# Patient Record
Sex: Male | Born: 1950 | Race: Black or African American | Hispanic: No | Marital: Married | Smoking: Never smoker
Health system: Southern US, Community
[De-identification: ages and names within clinical notes are randomized; demographics above are authoritative.]

## PROBLEM LIST (undated history)

## (undated) DIAGNOSIS — M1990 Unspecified osteoarthritis, unspecified site: Secondary | ICD-10-CM

## (undated) DIAGNOSIS — G4733 Obstructive sleep apnea (adult) (pediatric): Secondary | ICD-10-CM

## (undated) HISTORY — DX: Obstructive sleep apnea (adult) (pediatric): G47.33

## (undated) HISTORY — DX: Unspecified osteoarthritis, unspecified site: M19.90

## (undated) HISTORY — PX: WRIST SURGERY: SHX841

---

## 1998-12-05 ENCOUNTER — Other Ambulatory Visit: Admission: RE | Admit: 1998-12-05 | Discharge: 1998-12-05 | Payer: Self-pay | Admitting: Orthopedic Surgery

## 2001-11-11 ENCOUNTER — Ambulatory Visit (HOSPITAL_BASED_OUTPATIENT_CLINIC_OR_DEPARTMENT_OTHER): Admission: RE | Admit: 2001-11-11 | Discharge: 2001-11-11 | Payer: Self-pay | Admitting: *Deleted

## 2011-09-21 ENCOUNTER — Ambulatory Visit (INDEPENDENT_AMBULATORY_CARE_PROVIDER_SITE_OTHER): Payer: BC Managed Care – PPO | Admitting: Pulmonary Disease

## 2011-09-21 ENCOUNTER — Encounter: Payer: Self-pay | Admitting: Pulmonary Disease

## 2011-09-21 VITALS — BP 142/82 | HR 83 | Temp 98.0°F | Ht 71.0 in | Wt 223.2 lb

## 2011-09-21 DIAGNOSIS — G4733 Obstructive sleep apnea (adult) (pediatric): Secondary | ICD-10-CM | POA: Insufficient documentation

## 2011-09-21 NOTE — Assessment & Plan Note (Signed)
The patient has a history of severe obstructive sleep apnea, and has been compliant with CPAP since his last visit in 2003.  He feels that he is doing well, and has kept up with mask and filters changes.  However, he is overdue for a new CPAP machine, and will set this up for him.  I have encouraged him to work on weight reduction, and to followup with me in one year.

## 2011-09-21 NOTE — Patient Instructions (Signed)
Will arrange for you to get a new cpap machine at the same pressure. Work on [employer] loss Will see you back in one year.

## 2011-09-21 NOTE — Progress Notes (Signed)
  Subjective:    Patient ID: Kurt Underwood, male    DOB: Mar 06, 1951, 60 y.o.   MRN: [id number]  HPI The patient is a 61 year old male who comes in today for management of obstructive sleep apnea.  He was diagnosed with severe OSA 2003, with an AHI of 66 events per hour.  He was seen in the office, and started on CPAP therapy.  I have not seen him back since 2003, but the patient states that he has been very compliant with his device.  He uses a full face mask, and has kept up with mask changes and filters.  He feels that he is sleeping very well, and is rested in the mornings upon arising.  He is satisfied with his alertness during the day, and denies any sleepiness with driving.  His weight is neutral since the last visit.  His Epworth score today is 6.  Sleep Questionnaire: What time do you typically go to bed?( Between what hours) 9:30- 10:00 pm How long does it take you to fall asleep? 30 mins How many times during the night do you wake up? 0 What time do you get out of bed to start your day? 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 (In pounds) 0 oz (0 kg) Have you ever had a sleep study before? Yes If yes, location of study? [facility] If yes, date of study? 2003 Do you currently use CPAP? Yes If so, what pressure? Do you wear oxygen at any time? No    Review of Systems  Constitutional: Negative for fever and unexpected weight change.  HENT: Negative for ear pain, nosebleeds, congestion, sore throat, rhinorrhea, sneezing, trouble swallowing, dental problem, postnasal drip and sinus pressure.   Eyes: Negative for redness and itching.  Respiratory: Negative for cough, chest tightness, shortness of breath and wheezing.   Cardiovascular: Negative for palpitations and leg swelling.  Gastrointestinal: Negative for nausea and vomiting.  Genitourinary: Negative for dysuria.  Musculoskeletal: Negative for joint  swelling.  Skin: Negative for rash.  Neurological: Negative for headaches.  Hematological: Does not bruise/bleed easily.  Psychiatric/Behavioral: Negative for dysphoric mood. The patient is not nervous/anxious.        Objective:   Physical Exam Constitutional:  Well developed, no acute distress  HENT:  Nares patent without discharge  Oropharynx without exudate, palate and uvula are elongated.  Eyes:  Perrla, eomi, no scleral icterus  Neck:  No JVD, no TMG  Cardiovascular:  Normal rate, regular rhythm, no rubs or gallops.  No murmurs        Intact distal pulses  Pulmonary :  Normal breath sounds, no stridor or respiratory distress   No rales, rhonchi, or wheezing  Abdominal:  Soft, nondistended, bowel sounds present.  No tenderness noted.   Musculoskeletal:  No lower extremity edema noted.  Lymph Nodes:  No cervical lymphadenopathy noted  Skin:  No cyanosis noted  Neurologic:  Alert, appropriate, moves all 4 extremities without obvious deficit.         Assessment & Plan:

## 2012-01-10 ENCOUNTER — Telehealth: Payer: Self-pay | Admitting: Pulmonary Disease

## 2012-01-10 NOTE — Telephone Encounter (Signed)
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 Pt called stating that he was seen by [facility] in Jan 2013 for CPAP f/u and was needing notes or a letter faxed to his Dr at [facility] stating that he has been compliant with his CPAP machine. Per [facility] no download was done at office visit 09-21-11. 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 EHR 09/21/2011 OFFICE VISIT NOTE with [facility] faxed to [phone]. Angelica Chessman will contact our office if further information is needed once the Dr reviews the notes faxed.  Seward Meth, [facility]ena Saner Medical Assistant 01/10/12

## 2012-07-07 ENCOUNTER — Other Ambulatory Visit (INDEPENDENT_AMBULATORY_CARE_PROVIDER_SITE_OTHER): Payer: BC Managed Care – PPO

## 2012-07-07 ENCOUNTER — Ambulatory Visit (INDEPENDENT_AMBULATORY_CARE_PROVIDER_SITE_OTHER): Payer: BC Managed Care – PPO | Admitting: Internal Medicine

## 2012-07-07 ENCOUNTER — Ambulatory Visit (INDEPENDENT_AMBULATORY_CARE_PROVIDER_SITE_OTHER)
Admission: RE | Admit: 2012-07-07 | Discharge: 2012-07-07 | Disposition: A | Discharge status: Home / Self Care | Payer: BC Managed Care – PPO | Source: Ambulatory Visit | Attending: Internal Medicine | Admitting: Internal Medicine

## 2012-07-07 ENCOUNTER — Encounter: Payer: Self-pay | Admitting: Internal Medicine

## 2012-07-07 VITALS — BP 130/80 | HR 65 | Temp 98.2°F | Resp 16 | Ht 71.0 in | Wt 220.1 lb

## 2012-07-07 DIAGNOSIS — R9431 Abnormal electrocardiogram [ECG] [EKG]: Secondary | ICD-10-CM | POA: Insufficient documentation

## 2012-07-07 DIAGNOSIS — M545 Low back pain: Secondary | ICD-10-CM

## 2012-07-07 DIAGNOSIS — Z136 Encounter for screening for cardiovascular disorders: Secondary | ICD-10-CM | POA: Insufficient documentation

## 2012-07-07 DIAGNOSIS — Z0000 Encounter for general adult medical examination without abnormal findings: Secondary | ICD-10-CM

## 2012-07-07 LAB — COMPREHENSIVE METABOLIC PANEL
ALT: 26 U/L (ref 0–53)
AST: 26 U/L (ref 0–37)
Albumin: 4.2 g/dL (ref 3.5–5.2)
Alkaline Phosphatase: 56 U/L (ref 39–117)
Calcium: 9.2 mg/dL (ref 8.4–10.5)
Chloride: 104 mEq/L (ref 96–112)
Potassium: 4.2 mEq/L (ref 3.5–5.1)
Sodium: 140 mEq/L (ref 135–145)
Total Protein: 7.3 g/dL (ref 6.0–8.3)

## 2012-07-07 LAB — URINALYSIS, ROUTINE W REFLEX MICROSCOPIC
Leukocytes, UA: NEGATIVE
Nitrite: NEGATIVE
Specific Gravity, Urine: 1.01 (ref 1.000–1.030)
Urine Glucose: NEGATIVE
Urobilinogen, UA: 0.2 (ref 0.0–1.0)

## 2012-07-07 LAB — CBC WITH DIFFERENTIAL/PLATELET
Basophils Absolute: 0 10*3/uL (ref 0.0–0.1)
Basophils Relative: 0.4 % (ref 0.0–3.0)
Eosinophils Absolute: 0.7 10*3/uL (ref 0.0–0.7)
Lymphocytes Relative: 34 % (ref 12.0–46.0)
MCHC: 33.2 g/dL (ref 30.0–36.0)
MCV: 88.5 fl (ref 78.0–100.0)
Monocytes Absolute: 0.3 10*3/uL (ref 0.1–1.0)
Neutrophils Relative %: 48.3 % (ref 43.0–77.0)
RDW: 13.7 % (ref 11.5–14.6)

## 2012-07-07 LAB — LIPID PANEL
Cholesterol: 181 mg/dL (ref 0–200)
LDL Cholesterol: 123 mg/dL — ABNORMAL HIGH (ref 0–99)
Total CHOL/HDL Ratio: 4
VLDL: 9.4 mg/dL (ref 0.0–40.0)

## 2012-07-07 NOTE — Assessment & Plan Note (Signed)
He has no s/s though his EKG shows a dramatic loss of voltage in V1 and V2, I have asked him to see cardiology to consider further testing

## 2012-07-07 NOTE — Patient Instructions (Signed)
Health Maintenance, Males A healthy lifestyle and preventative care can promote health and wellness.  Maintain regular health, dental, and eye exams.  Eat a healthy diet. Foods like vegetables, fruits, whole grains, low-fat dairy products, and lean protein foods contain the nutrients you need without too many calories. Decrease your intake of foods high in solid fats, added sugars, and salt. Get information about a proper diet from your caregiver, if necessary.  Regular physical exercise is one of the most important things you can do for your health. Most adults should get at least 150 minutes of moderate-intensity exercise (any activity that increases your heart rate and causes you to sweat) each week. In addition, most adults need muscle-strengthening exercises on 2 or more days a week.   Maintain a healthy weight. The body mass index (BMI) is a screening tool to identify possible weight problems. It provides an estimate of body fat based on height and weight. Your caregiver can help determine your BMI, and can help you achieve or maintain a healthy weight. For adults 20 years and older:  A BMI below 18.5 is considered underweight.  A BMI of 18.5 to 24.9 is normal.  A BMI of 25 to 29.9 is considered overweight.  A BMI of 30 and above is considered obese.  Maintain normal blood lipids and cholesterol by exercising and minimizing your intake of saturated fat. Eat a balanced diet with plenty of fruits and vegetables. Blood tests for lipids and cholesterol should begin at age 20 and be repeated every 5 years. If your lipid or cholesterol levels are high, you are over 50, or you are a high risk for heart disease, you may need your cholesterol levels checked more frequently.Ongoing high lipid and cholesterol levels should be treated with medicines, if diet and exercise are not effective.  If you smoke, find out from your caregiver how to quit. If you do not use tobacco, do not start.  If you  choose to drink alcohol, do not exceed 2 drinks per day. One drink is considered to be 12 ounces (355 mL) of beer, 5 ounces (148 mL) of wine, or 1.5 ounces (44 mL) of liquor.  Avoid use of street drugs. Do not share needles with anyone. Ask for help if you need support or instructions about stopping the use of drugs.  High blood pressure causes heart disease and increases the risk of stroke. Blood pressure should be checked at least every 1 to 2 years. Ongoing high blood pressure should be treated with medicines if weight loss and exercise are not effective.  If you are 45 to 61 years old, ask your caregiver if you should take aspirin to prevent heart disease.  Diabetes screening involves taking a blood sample to check your fasting blood sugar level. This should be done once every 3 years, after age 45, if you are within normal weight and without risk factors for diabetes. Testing should be considered at a younger age or be carried out more frequently if you are overweight and have at least 1 risk factor for diabetes.  Colorectal cancer can be detected and often prevented. Most routine colorectal cancer screening begins at the age of 50 and continues through age 75. However, your caregiver may recommend screening at an earlier age if you have risk factors for colon cancer. On a yearly basis, your caregiver may provide home test kits to check for hidden blood in the stool. Use of a small camera at the end of a tube,   to directly examine the colon (sigmoidoscopy or colonoscopy), can detect the earliest forms of colorectal cancer. Talk to your caregiver about this at age 50, when routine screening begins. Direct examination of the colon should be repeated every 5 to 10 years through age 75, unless early forms of pre-cancerous polyps or small growths are found.  Hepatitis C blood testing is recommended for all people born from 1945 through 1965 and any individual with known risks for hepatitis C.  Healthy  men should no longer receive prostate-specific antigen (PSA) blood tests as part of routine cancer screening. Consult with your caregiver about prostate cancer screening.  Testicular cancer screening is not recommended for adolescents or adult males who have no symptoms. Screening includes self-exam, caregiver exam, and other screening tests. Consult with your caregiver about any symptoms you have or any concerns you have about testicular cancer.  Practice safe sex. Use condoms and avoid high-risk sexual practices to reduce the spread of sexually transmitted infections (STIs).  Use sunscreen with a sun protection factor (SPF) of 30 or greater. Apply sunscreen liberally and repeatedly throughout the day. You should seek shade when your shadow is shorter than you. Protect yourself by wearing long sleeves, pants, a wide-brimmed hat, and sunglasses year round, whenever you are outdoors.  Notify your caregiver of new moles or changes in moles, especially if there is a change in shape or color. Also notify your caregiver if a mole is larger than the size of a pencil eraser.  A one-time screening for abdominal aortic aneurysm (AAA) and surgical repair of large AAAs by sound wave imaging (ultrasonography) is recommended for ages 65 to 75 years who are current or former smokers.  Stay current with your immunizations. Document Released: 02/16/2008 Document Revised: 11/12/2011 Document Reviewed: 01/15/2011 ExitCare Patient Information 2013 [employer]. Back Pain, Adult Low back pain is very common. About 1 in 5 people have back pain.The cause of low back pain is rarely dangerous. The pain often gets better over time.About half of people with a sudden onset of back pain feel better in just 2 weeks. About 8 in 10 people feel better by 6 weeks.  CAUSES Some common causes of back pain include:  Strain of the muscles or ligaments supporting the spine.  Wear and tear (degeneration) of the spinal  discs.  Arthritis.  Direct injury to the back. DIAGNOSIS Most of the time, the direct cause of low back pain is not known.However, back pain can be treated effectively even when the exact cause of the pain is unknown.Answering your caregiver's questions about your overall health and symptoms is one of the most accurate ways to make sure the cause of your pain is not dangerous. If your caregiver needs more information, he or she may order lab work or imaging tests (X-rays or MRIs).However, even if imaging tests show changes in your back, this usually does not require surgery. HOME CARE INSTRUCTIONS For many people, back pain returns.Since low back pain is rarely dangerous, it is often a condition that people can learn to manageon their own.   Remain active. It is stressful on the back to sit or stand in one place. Do not sit, drive, or stand in one place for more than 30 minutes at a time. Take short walks on level surfaces as soon as pain allows.Try to increase the length of time you walk each day.  Do not stay in bed.Resting more than 1 or 2 days can delay your recovery.  Do not   avoid exercise or work.Your body is made to move.It is not dangerous to be active, even though your back may hurt.Your back will likely heal faster if you return to being active before your pain is gone.  Pay attention to your body when you bend and lift. Many people have less discomfortwhen lifting if they bend their knees, keep the load close to their bodies,and avoid twisting. Often, the most comfortable positions are those that put less stress on your recovering back.  Find a comfortable position to sleep. Use a firm mattress and lie on your side with your knees slightly bent. If you lie on your back, put a pillow under your knees.  Only take over-the-counter or prescription medicines as directed by your caregiver. Over-the-counter medicines to reduce pain and inflammation are often the most  helpful.Your caregiver may prescribe muscle relaxant drugs.These medicines help dull your pain so you can more quickly return to your normal activities and healthy exercise.  Put ice on the injured area.  Put ice in a plastic bag.  Place a towel between your skin and the bag.  Leave the ice on for 15 to 20 minutes, 3 to 4 times a day for the first 2 to 3 days. After that, ice and heat may be alternated to reduce pain and spasms.  Ask your caregiver about trying back exercises and gentle massage. This may be of some benefit.  Avoid feeling anxious or stressed.Stress increases muscle tension and can worsen back pain.It is important to recognize when you are anxious or stressed and learn ways to manage it.Exercise is a great option. SEEK MEDICAL CARE IF:  You have pain that is not relieved with rest or medicine.  You have pain that does not improve in 1 week.  You have new symptoms.  You are generally not feeling well. SEEK IMMEDIATE MEDICAL CARE IF:   You have pain that radiates from your back into your legs.  You develop new bowel or bladder control problems.  You have unusual weakness or numbness in your arms or legs.  You develop nausea or vomiting.  You develop abdominal pain.  You feel faint. Document Released: 08/20/2005 Document Revised: 02/19/2012 Document Reviewed: 01/08/2011 ExitCare Patient Information 2013 [employer].  

## 2012-07-07 NOTE — Assessment & Plan Note (Signed)
I will check plain films on him today, he will continue nsaids as needed

## 2012-07-07 NOTE — Assessment & Plan Note (Signed)
Exam done, he refused all vaccines today, labs ordered, pt ed material was given

## 2012-07-07 NOTE — Progress Notes (Signed)
Subjective:    Patient ID: Kurt Underwood, male    DOB: Sep 04, 1950, 61 y.o.   MRN: [id number]  Back Pain This is a chronic problem. The current episode started more than 1 year ago. The problem occurs intermittently. The problem has been gradually worsening since onset. The pain is present in the lumbar spine. The quality of the pain is described as aching. The pain radiates to the left thigh and right thigh. The pain is at a severity of 2/10. The pain is mild. The pain is worse during the day. The symptoms are aggravated by position and bending. Stiffness is present all day. Pertinent negatives include no abdominal pain, bladder incontinence, bowel incontinence, chest pain, dysuria, fever, headaches, leg pain, numbness, paresis, paresthesias, pelvic pain, perianal numbness, tingling, weakness or weight loss. He has tried NSAIDs for the symptoms. The treatment provided moderate relief.      Review of Systems  Constitutional: Negative for fever, chills, weight loss, diaphoresis, activity change, appetite change, fatigue and unexpected weight change.  HENT: Negative.   Eyes: Negative.   Respiratory: Negative for cough, choking, chest tightness, shortness of breath, wheezing and stridor.   Cardiovascular: Negative for chest pain, palpitations and leg swelling.  Gastrointestinal: Negative for nausea, vomiting, abdominal pain, diarrhea, constipation and bowel incontinence.  Genitourinary: Negative.  Negative for bladder incontinence, dysuria and pelvic pain.  Musculoskeletal: Positive for back pain. Negative for myalgias, joint swelling, arthralgias and gait problem.  Skin: Negative for color change, pallor, rash and wound.  Neurological: Negative for dizziness, tingling, syncope, weakness, light-headedness, numbness, headaches and paresthesias.  Hematological: Negative for adenopathy. Does not bruise/bleed easily.  Psychiatric/Behavioral: Negative.        Objective:   Physical Exam  Vitals  reviewed. Constitutional: He is oriented to person, place, and time. He appears well-developed and well-nourished. No distress.  HENT:  Head: Normocephalic and atraumatic.  Mouth/Throat: Oropharynx is clear and moist. No oropharyngeal exudate.  Eyes: Conjunctivae normal are normal. Right eye exhibits no discharge. Left eye exhibits no discharge. No scleral icterus.  Neck: Normal range of motion. Neck supple. No JVD present. No tracheal deviation present. No thyromegaly present.  Cardiovascular: Normal rate, regular rhythm, normal heart sounds and intact distal pulses.  Exam reveals no gallop and no friction rub.   No murmur heard. Pulmonary/Chest: Effort normal and breath sounds normal. No stridor. No respiratory distress. He has no wheezes. He has no rales. He exhibits no tenderness.  Abdominal: Soft. Bowel sounds are normal. He exhibits no distension and no mass. There is no tenderness. There is no rebound and no guarding. Hernia confirmed negative in the right inguinal area and confirmed negative in the left inguinal area.  Genitourinary: Rectum normal, prostate normal, testes normal and penis normal. Rectal exam shows no external hemorrhoid, no internal hemorrhoid, no fissure, no mass, no tenderness and anal tone normal. Guaiac negative stool. Prostate is not enlarged and not tender. Right testis shows no mass, no swelling and no tenderness. Right testis is descended. Left testis shows no mass, no swelling and no tenderness. Left testis is descended. Uncircumcised. No phimosis, paraphimosis, hypospadias, penile erythema or penile tenderness. No discharge found.  Musculoskeletal: Normal range of motion. He exhibits no edema and no tenderness.       Lumbar back: Normal. He exhibits normal range of motion, no tenderness, no bony tenderness, no swelling, no edema, no deformity, no laceration, no pain, no spasm and normal pulse.  Lymphadenopathy:    He has no cervical adenopathy.  Right: No  inguinal adenopathy present.       Left: No inguinal adenopathy present.  Neurological: He is alert and oriented to person, place, and time. He has normal strength. He displays no atrophy, no tremor and normal reflexes. No cranial nerve deficit or sensory deficit. He exhibits normal muscle tone. He displays a negative Romberg sign. He displays no seizure activity. Coordination and gait normal.  Reflex Scores:      Tricep reflexes are 1+ on the right side and 1+ on the left side.      Bicep reflexes are 1+ on the right side and 1+ on the left side.      Brachioradialis reflexes are 1+ on the right side and 1+ on the left side.      Patellar reflexes are 1+ on the right side and 1+ on the left side.      Achilles reflexes are 1+ on the right side and 1+ on the left side.      - SLR in BLE  Skin: Skin is warm and dry. No rash noted. He is not diaphoretic. No erythema. No pallor.  Psychiatric: He has a normal mood and affect. His behavior is normal. Judgment and thought content normal.          Assessment & Plan:

## 2012-07-10 ENCOUNTER — Institutional Professional Consult (permissible substitution): Payer: BC Managed Care – PPO | Admitting: Cardiovascular Disease

## 2012-07-30 ENCOUNTER — Institutional Professional Consult (permissible substitution): Payer: BC Managed Care – PPO | Admitting: Cardiovascular Disease

## 2012-09-22 ENCOUNTER — Ambulatory Visit (INDEPENDENT_AMBULATORY_CARE_PROVIDER_SITE_OTHER): Payer: BC Managed Care – PPO | Admitting: Pulmonary Disease

## 2012-09-22 ENCOUNTER — Encounter: Payer: Self-pay | Admitting: Pulmonary Disease

## 2012-09-22 VITALS — BP 132/86 | HR 70 | Temp 98.2°F | Ht 71.0 in | Wt 228.0 lb

## 2012-09-22 DIAGNOSIS — G4733 Obstructive sleep apnea (adult) (pediatric): Secondary | ICD-10-CM

## 2012-09-22 NOTE — Progress Notes (Signed)
  Subjective:    Patient ID: Kurt Underwood, male    DOB: 1951-07-05, 62 y.o.   MRN: [id number]  HPI The patient comes in today for followup of his obstructive sleep apnea.  He is wearing CPAP compliant way, and is having no issues with his pressure.  He thinks his mask needs to be sized a little larger, and also requires new headgear.  He feels that he is sleeping well, and denies any alertness issues during the day.  Of note, his weight is up 5 pounds since last visit.   Review of Systems  Constitutional: Negative for fever and unexpected weight change.  HENT: Negative for ear pain, nosebleeds, congestion, sore throat, rhinorrhea, sneezing, trouble swallowing, dental problem, postnasal drip and sinus pressure.   Eyes: Negative for redness and itching.  Respiratory: Negative for cough, chest tightness, shortness of breath and wheezing.   Cardiovascular: Negative for palpitations and leg swelling.  Gastrointestinal: Negative for nausea and vomiting.  Genitourinary: Negative for dysuria.  Musculoskeletal: Positive for back pain. Negative for joint swelling.  Skin: Negative for rash.  Neurological: Negative for headaches.  Hematological: Does not bruise/bleed easily.  Psychiatric/Behavioral: Negative for dysphoric mood. The patient is not nervous/anxious.        Objective:   Physical Exam Ow male in nad Nose without purulence or discharge noted no skin breakdown or pressure necrosis from the CPAP mask No lymphadenopathy or thyromegaly Lower extremities without edema, cyanosis Alert and oriented, moves all 4 extremities.  Does not appear to be sleepy.        Assessment & Plan:

## 2012-09-22 NOTE — Patient Instructions (Addendum)
Will send an order to your equipment company to work with you on mask fit and headgear. Work on weight loss followup with me in one year.

## 2012-09-22 NOTE — Assessment & Plan Note (Signed)
The patient is doing well overall with CPAP, and feels that it has helped his sleep and daytime alertness.  We will have his equipment company work with him a mask fit and head gear, and I encouraged him to work aggressively on weight loss.  He will followup again in one year.

## 2012-10-10 ENCOUNTER — Ambulatory Visit (INDEPENDENT_AMBULATORY_CARE_PROVIDER_SITE_OTHER): Payer: BC Managed Care – PPO | Admitting: Internal Medicine

## 2012-10-10 ENCOUNTER — Encounter: Payer: Self-pay | Admitting: Internal Medicine

## 2012-10-10 VITALS — BP 130/86 | HR 64 | Temp 98.7°F | Resp 16 | Wt 224.0 lb

## 2012-10-10 DIAGNOSIS — M545 Low back pain: Secondary | ICD-10-CM

## 2012-10-10 MED ORDER — TRAMADOL HCL 50 MG PO TABS: 50.0000 mg | ORAL_TABLET | Freq: Three times a day (TID) | ORAL | Status: DC | PRN

## 2012-10-10 NOTE — Progress Notes (Signed)
Subjective:    Patient ID: Kurt Underwood, male    DOB: [birth year], 62 y.o.   MRN: [id number]  Back Pain This is a recurrent problem. The current episode started more than 1 month ago. The problem occurs intermittently. The problem is unchanged. The pain is present in the lumbar spine. The quality of the pain is described as aching. The pain radiates to the left thigh and right thigh. The pain is at a severity of 4/10. The pain is moderate. The pain is the same all the time. The symptoms are aggravated by position, bending and standing. Associated symptoms include leg pain (left) and numbness (left leg). Pertinent negatives include no abdominal pain, bladder incontinence, bowel incontinence, chest pain, dysuria, headaches, paresis, paresthesias, pelvic pain, perianal numbness, tingling, weakness or weight loss. He has tried NSAIDs for the symptoms. The treatment provided mild relief.      Review of Systems  Constitutional: Negative.  Negative for weight loss.  HENT: Negative.   Eyes: Negative.   Respiratory: Negative.   Cardiovascular: Negative.  Negative for chest pain.  Gastrointestinal: Negative.  Negative for nausea, vomiting, abdominal pain, diarrhea, constipation and bowel incontinence.  Genitourinary: Negative.  Negative for bladder incontinence, dysuria and pelvic pain.  Musculoskeletal: Positive for back pain. Negative for myalgias, joint swelling, arthralgias and gait problem.  Skin: Negative.   Neurological: Positive for numbness (left leg). Negative for tingling, weakness, light-headedness, headaches and paresthesias.  Hematological: Negative.  Negative for adenopathy. Does not bruise/bleed easily.  Psychiatric/Behavioral: Negative.        Objective:   Physical Exam  Vitals reviewed. Constitutional: He is oriented to person, place, and time. He appears well-developed and well-nourished. No distress.  HENT:  Head: Normocephalic and atraumatic.  Mouth/Throat: Oropharynx is  clear and moist. No oropharyngeal exudate.  Eyes: Conjunctivae normal are normal. Right eye exhibits no discharge. Left eye exhibits no discharge. No scleral icterus.  Neck: Normal range of motion. Neck supple. No JVD present. No tracheal deviation present. No thyromegaly present.  Cardiovascular: Normal rate, regular rhythm, normal heart sounds and intact distal pulses.  Exam reveals no gallop and no friction rub.   No murmur heard. Pulmonary/Chest: Effort normal and breath sounds normal. No stridor. No respiratory distress. He has no wheezes. He has no rales. He exhibits no tenderness.  Abdominal: Soft. Bowel sounds are normal. He exhibits no distension and no mass. There is no tenderness. There is no rebound and no guarding.  Musculoskeletal: Normal range of motion. He exhibits no edema and no tenderness.       Lumbar back: Normal. He exhibits normal range of motion, no tenderness, no bony tenderness, no swelling, no edema, no deformity, no laceration, no pain and no spasm.  Lymphadenopathy:    He has no cervical adenopathy.  Neurological: He is alert and oriented to person, place, and time. He displays no atrophy, no tremor and normal reflexes. No cranial nerve deficit or sensory deficit. He exhibits normal muscle tone. He displays a negative Romberg sign. He displays no seizure activity. Coordination and gait normal.  Reflex Scores:      Brachioradialis reflexes are 0 on the right side and 0 on the left side.      Patellar reflexes are 0 on the right side and 0 on the left side.      Achilles reflexes are 0 on the right side and 0 on the left side.      - SLR in BLE  Skin: Skin is warm and  dry. No rash noted. He is not diaphoretic. No erythema. No pallor.  Psychiatric: He has a normal mood and affect. His behavior is normal. Judgment and thought content normal.      Lab Results  Component Value Date   WBC 6.3 07/07/2012   HGB 14.2 07/07/2012   HCT 42.6 07/07/2012   PLT 304.0 07/07/2012    GLUCOSE 98 07/07/2012   CHOL 181 07/07/2012   TRIG 47.0 07/07/2012   HDL 48.40 07/07/2012   LDLCALC 123* 07/07/2012   ALT 26 07/07/2012   AST 26 07/07/2012   NA 140 07/07/2012   K 4.2 07/07/2012   CL 104 07/07/2012   CREATININE 0.7 07/07/2012   BUN 12 07/07/2012   CO2 29 07/07/2012   TSH 1.66 07/07/2012   PSA 0.80 07/07/2012     LUMBAR SPINE - COMPLETE 4+ VIEW  Comparison: None.  Findings: Five views of the lumbar spine submitted. There is mild  compression deformity superior endplate of the L1 vertebral body of  indeterminate age. Clinical correlation is necessary. Mild disc  space flattening with mild anterior spurring at L1 and L2 level.  Mild anterior spurring upper endplate of the L3-L4 and L5 vertebral  body.  IMPRESSION:  Mild compression deformity superior endplate of the L1 vertebral  body of indeterminate age. Clinical correlation is necessary.  Mild disc space flattening with mild anterior spurring at L1 and L2  level. Mild anterior spurring upper endplate of the L3, L4 and L5  vertebral body.  Assessment & Plan:

## 2012-10-10 NOTE — Assessment & Plan Note (Signed)
Will continue nsaids and add tramadol for pain relief He wants to see pain management to consider ESI

## 2012-10-10 NOTE — Patient Instructions (Signed)
Back Pain, Adult Low back pain is very common. About 1 in 5 people have back pain.The cause of low back pain is rarely dangerous. The pain often gets better over time.About half of people with a sudden onset of back pain feel better in just 2 weeks. About 8 in 10 people feel better by 6 weeks.  CAUSES Some common causes of back pain include:  Strain of the muscles or ligaments supporting the spine.  Wear and tear (degeneration) of the spinal discs.  Arthritis.  Direct injury to the back. DIAGNOSIS Most of the time, the direct cause of low back pain is not known.However, back pain can be treated effectively even when the exact cause of the pain is unknown.Answering your caregiver's questions about your overall health and symptoms is one of the most accurate ways to make sure the cause of your pain is not dangerous. If your caregiver needs more information, he or she may order lab work or imaging tests (X-rays or MRIs).However, even if imaging tests show changes in your back, this usually does not require surgery. HOME CARE INSTRUCTIONS For many people, back pain returns.Since low back pain is rarely dangerous, it is often a condition that people can learn to manageon their own.   Remain active. It is stressful on the back to sit or stand in one place. Do not sit, drive, or stand in one place for more than 30 minutes at a time. Take short walks on level surfaces as soon as pain allows.Try to increase the length of time you walk each day.  Do not stay in bed.Resting more than 1 or 2 days can delay your recovery.  Do not avoid exercise or work.Your body is made to move.It is not dangerous to be active, even though your back may hurt.Your back will likely heal faster if you return to being active before your pain is gone.  Pay attention to your body when you bend and lift. Many people have less discomfortwhen lifting if they bend their knees, keep the load close to their bodies,and  avoid twisting. Often, the most comfortable positions are those that put less stress on your recovering back.  Find a comfortable position to sleep. Use a firm mattress and lie on your side with your knees slightly bent. If you lie on your back, put a pillow under your knees.  Only take over-the-counter or prescription medicines as directed by your caregiver. Over-the-counter medicines to reduce pain and inflammation are often the most helpful.Your caregiver may prescribe muscle relaxant drugs.These medicines help dull your pain so you can more quickly return to your normal activities and healthy exercise.  Put ice on the injured area.  Put ice in a plastic bag.  Place a towel between your skin and the bag.  Leave the ice on for 15 to 20 minutes, 3 to 4 times a day for the first 2 to 3 days. After that, ice and heat may be alternated to reduce pain and spasms.  Ask your caregiver about trying back exercises and gentle massage. This may be of some benefit.  Avoid feeling anxious or stressed.Stress increases muscle tension and can worsen back pain.It is important to recognize when you are anxious or stressed and learn ways to manage it.Exercise is a great option. SEEK MEDICAL CARE IF:  You have pain that is not relieved with rest or medicine.  You have pain that does not improve in 1 week.  You have new symptoms.  You are generally   not feeling well. SEEK IMMEDIATE MEDICAL CARE IF:   You have pain that radiates from your back into your legs.  You develop new bowel or bladder control problems.  You have unusual weakness or numbness in your arms or legs.  You develop nausea or vomiting.  You develop abdominal pain.  You feel faint. Document Released: 08/20/2005 Document Revised: 02/19/2012 Document Reviewed: 01/08/2011 [employer] Patient Information 2013 [employer].  

## 2012-10-30 ENCOUNTER — Encounter: Payer: Self-pay | Admitting: Physical Medicine & Rehabilitation

## 2012-11-24 ENCOUNTER — Ambulatory Visit (HOSPITAL_BASED_OUTPATIENT_CLINIC_OR_DEPARTMENT_OTHER): Payer: BC Managed Care – PPO | Admitting: Physical Medicine & Rehabilitation

## 2012-11-24 ENCOUNTER — Encounter: Payer: Self-pay | Admitting: Physical Medicine & Rehabilitation

## 2012-11-24 ENCOUNTER — Encounter: Payer: BC Managed Care – PPO | Attending: Physical Medicine & Rehabilitation

## 2012-11-24 VITALS — BP 152/75 | HR 65 | Resp 14 | Ht 71.0 in | Wt 224.0 lb

## 2012-11-24 DIAGNOSIS — X58XXXA Exposure to other specified factors, initial encounter: Secondary | ICD-10-CM | POA: Insufficient documentation

## 2012-11-24 DIAGNOSIS — Z5181 Encounter for therapeutic drug level monitoring: Secondary | ICD-10-CM

## 2012-11-24 DIAGNOSIS — M533 Sacrococcygeal disorders, not elsewhere classified: Secondary | ICD-10-CM

## 2012-11-24 DIAGNOSIS — Z79899 Other long term (current) drug therapy: Secondary | ICD-10-CM

## 2012-11-24 DIAGNOSIS — M549 Dorsalgia, unspecified: Secondary | ICD-10-CM

## 2012-11-24 DIAGNOSIS — M545 Low back pain, unspecified: Secondary | ICD-10-CM | POA: Insufficient documentation

## 2012-11-24 DIAGNOSIS — M79609 Pain in unspecified limb: Secondary | ICD-10-CM | POA: Insufficient documentation

## 2012-11-24 DIAGNOSIS — S32009A Unspecified fracture of unspecified lumbar vertebra, initial encounter for closed fracture: Secondary | ICD-10-CM | POA: Insufficient documentation

## 2012-11-24 NOTE — Patient Instructions (Signed)
Sacroiliac Joint Dysfunction The sacroiliac joint connects the lower part of the spine (the sacrum) with the bones of the pelvis. CAUSES  Sometimes, there is no obvious reason for sacroiliac joint dysfunction. Other times, it may occur   During pregnancy.  After injury, such as:  Car accidents.  Sport-related injuries.  Work-related injuries.  Due to one leg being shorter than the other.  Due to other conditions that affect the joints, such as:  Rheumatoid arthritis.  Gout.  Psoriasis.  Joint infection (septic arthritis). SYMPTOMS  Symptoms may include:  Pain in the:  Lower back.  Buttocks.  Groin.  Thighs and legs.  Difficult sitting, standing, walking, lying, bending or lifting. DIAGNOSIS  A number of tests may be used to help diagnose the cause of sacroiliac joint dysfunction, including:  Imaging tests to look for other causes of pain, including:  MRI.  CT scan.  Bone scan.  Diagnostic injection: During a special x-ray (called fluoroscopy), a needle is put into the sacroiliac joint. A numbing medicine is injected into the joint. If the pain is improved or stopped, the diagnosis of sacroiliac joint dysfunction is more likely. TREATMENT  There are a number of types of treatment used for sacroiliac joint dysfunction, including:  Only take over-the-counter or prescription medicines for pain, discomfort, or fever as directed by your caregiver.  Medications to relax muscles.  Rest. Decreasing activity can help cut down on painful muscle spasms and allow the back to heal.  Application of heat or ice to the lower back may improve muscle spasms and soothe pain.  Brace. A special back brace, called a sacroiliac belt, can help support the joint while your back is healing.  Physical therapy can help teach comfortable positions and exercises to strengthen muscles that support the sacroiliac joint.  Cortisone injections. Injections of steroid medicine into the  joint can help decrease swelling and improve pain.  Hyaluronic acid injections. This chemical improves lubrication within the sacroiliac joint, thereby decreasing pain.  Radiofrequency ablation. A special needle is placed into the joint, where it burns away nerves that are carrying pain messages from the joint.  Surgery. Because pain occurs during movement of the joint, screws and plates may be installed in order to limit or prevent joint motion. HOME CARE INSTRUCTIONS   Take all medications exactly as directed.  Follow instructions regarding both rest and physical activity, to avoid worsening the pain.  Do physical therapy exercises exactly as prescribed. SEEK IMMEDIATE MEDICAL CARE IF:  You experience increasingly severe pain.  You develop new symptoms, such as numbness or tingling in your legs or feet.  You lose bladder or bowel control.  Please complete physical therapy

## 2012-11-24 NOTE — Progress Notes (Signed)
Subjective:    Patient ID: Kurt Underwood, male    DOB: 1951-08-17, 62 y.o.   MRN: [id number]  HPI Larey Seat off of ladder at age 62. X-rays show compression fracture L1 of undetermined age Pain is now lower in the back. No new injury. Pain has been worsening over the last 9 months and radiates into the left calf. No numbness or tingling in the left leg. Feels weak in the left leg. Pain is relieved by tramadol. He does not need to take this every day. Does leg and back stretching exercises but has not been through physical therapy yet.  Recently retired as a [employer] architect.  LUMBAR SPINE - COMPLETE 4+ VIEW  Comparison: None.  Findings: Five views of the lumbar spine submitted. There is mild  compression deformity superior endplate of the L1 vertebral body of  indeterminate age. Clinical correlation is necessary. Mild disc  space flattening with mild anterior spurring at L1 and L2 level.  Mild anterior spurring upper endplate of the L3-L4 and L5 vertebral  body.  IMPRESSION:  Mild compression deformity superior endplate of the L1 vertebral  body of indeterminate age. Clinical correlation is necessary.  Mild disc space flattening with mild anterior spurring at L1 and L2  level. Mild anterior spurring upper endplate of the L3, L4 and L5  vertebral body.   Pain Inventory Average Pain 7 Pain Right Now 4 My pain is other  In the last 24 hours, has pain interfered with the following? General activity 3 Relation with others 3 Enjoyment of life 4 What TIME of day is your pain at its worst? daytime Sleep (in general) Good  Pain is worse with: walking, bending and standing Pain improves with: rest, heat/ice and medication Relief from Meds: 7  Mobility ability to climb steps?  yes do you drive?  yes  Function not employed: date last employed 14 retired  Neuro/Psych No problems in this area  Prior Studies Any changes since last visit?  no  Physicians involved in your care Any  changes since last visit?  no   Family History  Problem Relation Age of Onset  . Arthritis Other   . Hypertension Other   . Cancer Neg Hx   . Alcohol abuse Neg Hx   . Early death Neg Hx   . Heart disease Neg Hx   . Hyperlipidemia Neg Hx   . Kidney disease Neg Hx   . Stroke Neg Hx   . Hypertension Mother   . Hypertension Father    History   Social History  . Marital Status: Married    Spouse Name: N/A    Number of Children: Y  . Years of Education: N/A   Occupational History  . truck driver    Social History Main Topics  . Smoking status: Never Smoker   . Smokeless tobacco: Never Used  . Alcohol Use: No  . Drug Use: No  . Sexually Active: Yes   Other Topics Concern  . None   Social History Narrative  . None   Past Surgical History  Procedure Laterality Date  . Wrist surgery      Right   Past Medical History  Diagnosis Date  . OSA (obstructive sleep apnea)   . Arthritis    BP 152/75  Pulse 65  Resp 14  Ht 5\' 11"  (1.803 m)  Wt 224 lb (101.606 kg)  BMI 31.26 kg/m2  SpO2 99%     Review of Systems  Musculoskeletal: Positive for back  pain.  All other systems reviewed and are negative.       Objective:   Physical Exam  Nursing note and vitals reviewed. Constitutional: He is oriented to person, place, and time. He appears well-developed and well-nourished. No distress.  HENT:  Head: Normocephalic and atraumatic.  Right Ear: External ear normal.  Left Ear: External ear normal.  Eyes: Conjunctivae and EOM are normal. Pupils are equal, round, and reactive to light.  Neck: Normal range of motion.  Musculoskeletal:       Lumbar back: He exhibits decreased range of motion and pain. He exhibits no tenderness.  Negative straight leg raising Pain with leaning towards the left side No spine deformity Lumbar range of motion 75% of normal for flexion, extension, lateral patient and bending  Neurological: He is alert and oriented to person, place, and  time. He has normal strength. He displays no atrophy. No sensory deficit. He exhibits normal muscle tone. Coordination and gait normal.  Reflex Scores:      Patellar reflexes are 2+ on the right side and 2+ on the left side.      Achilles reflexes are 0 on the right side and 0 on the left side. Skin: Skin is warm and dry.  Psychiatric: He has a normal mood and affect.   Pain in the left SI  area with Faber's maneuver       Assessment & Plan:  1. Left-sided axial back pain radiating into the left lower extremity to the calf. No focal neurologic deficits. 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 We'll send him through physical therapy and failing this would proceed with sacroiliac injection. His pain is well controlled with tramadol which he takes on a intermittent basis. He does not take this every day  Do not think Patient will need narcotic analgesics.

## 2012-11-27 ENCOUNTER — Ambulatory Visit: Payer: BC Managed Care – PPO | Attending: Physical Medicine & Rehabilitation | Admitting: Physical Therapy

## 2012-11-27 DIAGNOSIS — IMO0001 Reserved for inherently not codable concepts without codable children: Secondary | ICD-10-CM | POA: Insufficient documentation

## 2012-11-27 DIAGNOSIS — M545 Low back pain, unspecified: Secondary | ICD-10-CM | POA: Insufficient documentation

## 2012-12-04 ENCOUNTER — Ambulatory Visit: Payer: BC Managed Care – PPO | Attending: Physical Medicine & Rehabilitation | Admitting: Physical Therapy

## 2012-12-04 DIAGNOSIS — M545 Low back pain, unspecified: Secondary | ICD-10-CM | POA: Insufficient documentation

## 2012-12-04 DIAGNOSIS — IMO0001 Reserved for inherently not codable concepts without codable children: Secondary | ICD-10-CM | POA: Insufficient documentation

## 2012-12-11 ENCOUNTER — Ambulatory Visit: Payer: BC Managed Care – PPO | Admitting: Physical Therapy

## 2012-12-17 ENCOUNTER — Ambulatory Visit: Payer: BC Managed Care – PPO | Admitting: Physical Therapy

## 2012-12-18 ENCOUNTER — Ambulatory Visit: Payer: BC Managed Care – PPO | Admitting: Physical Medicine & Rehabilitation

## 2012-12-24 ENCOUNTER — Ambulatory Visit: Payer: BC Managed Care – PPO | Admitting: Physical Therapy

## 2013-09-22 ENCOUNTER — Encounter: Payer: Self-pay | Admitting: Pulmonary Disease

## 2013-09-22 ENCOUNTER — Ambulatory Visit (INDEPENDENT_AMBULATORY_CARE_PROVIDER_SITE_OTHER): Payer: BC Managed Care – PPO | Admitting: Pulmonary Disease

## 2013-09-22 VITALS — BP 130/82 | HR 63 | Temp 98.3°F | Ht 71.0 in | Wt 228.2 lb

## 2013-09-22 DIAGNOSIS — G4733 Obstructive sleep apnea (adult) (pediatric): Secondary | ICD-10-CM

## 2013-09-22 NOTE — Patient Instructions (Signed)
Continue with cpap, and keep up with mask changes and supplies. Work on weight loss followup with me in one year.  

## 2013-09-22 NOTE — Assessment & Plan Note (Signed)
The pt is compliant with cpap, and feels he is sleeping well with adequate daytime alertness.  I have asked him to work on weight loss, and to keep up with his supplies.  I will see him back in one year.

## 2013-09-22 NOTE — Progress Notes (Signed)
   Subjective:    Patient ID: Kurt Underwood, Kurt Underwood    DOB: 27-Dec-1950, 63 y.o.   MRN: [id number]  HPI The patient comes in today for followup of his obstructive sleep apnea. He is wearing CPAP compliantly, and is having no issues with his mask fit or pressure. He is sleeping well at night, and has adequate daytime alertness. His weight is stable since the last visit.   Review of Systems  Constitutional: Negative for fever and unexpected weight change.  HENT: Negative for congestion, dental problem, ear pain, nosebleeds, postnasal drip, rhinorrhea, sinus pressure, sneezing, sore throat and trouble swallowing.   Eyes: Negative for redness and itching.  Respiratory: Negative for cough, chest tightness, shortness of breath and wheezing.   Cardiovascular: Negative for palpitations and leg swelling.  Gastrointestinal: Negative for nausea and vomiting.  Genitourinary: Negative for dysuria.  Musculoskeletal: Negative for joint swelling.  Skin: Negative for rash.  Neurological: Negative for headaches.  Hematological: Does not bruise/bleed easily.  Psychiatric/Behavioral: Negative for dysphoric mood. The patient is not nervous/anxious.        Objective:   Physical Exam Well-developed Kurt Underwood in no acute Nose without purulence or discharge noted Neck without lymphadenopathy or thyromegaly No skin breakdown or pressure necrosis from the CPAP mask Lower extremities without edema, no cyanosis Alert and oriented, moves all 4 extremities.       Assessment & Plan:

## 2013-10-21 ENCOUNTER — Ambulatory Visit (INDEPENDENT_AMBULATORY_CARE_PROVIDER_SITE_OTHER): Payer: BC Managed Care – PPO | Admitting: Internal Medicine

## 2013-10-21 ENCOUNTER — Ambulatory Visit: Payer: BC Managed Care – PPO | Admitting: Internal Medicine

## 2013-10-21 ENCOUNTER — Other Ambulatory Visit (INDEPENDENT_AMBULATORY_CARE_PROVIDER_SITE_OTHER): Payer: BC Managed Care – PPO

## 2013-10-21 ENCOUNTER — Encounter: Payer: Self-pay | Admitting: Internal Medicine

## 2013-10-21 VITALS — BP 138/82 | HR 77 | Temp 97.5°F | Resp 16 | Ht 71.0 in | Wt 222.0 lb

## 2013-10-21 DIAGNOSIS — N529 Male erectile dysfunction, unspecified: Secondary | ICD-10-CM | POA: Insufficient documentation

## 2013-10-21 DIAGNOSIS — Z0000 Encounter for general adult medical examination without abnormal findings: Secondary | ICD-10-CM

## 2013-10-21 DIAGNOSIS — J019 Acute sinusitis, unspecified: Secondary | ICD-10-CM

## 2013-10-21 LAB — CBC WITH DIFFERENTIAL/PLATELET
BASOS ABS: 0 10*3/uL (ref 0.0–0.1)
Basophils Relative: 0.6 % (ref 0.0–3.0)
EOS ABS: 0.4 10*3/uL (ref 0.0–0.7)
Eosinophils Relative: 5.7 % — ABNORMAL HIGH (ref 0.0–5.0)
HCT: 41.1 % (ref 39.0–52.0)
Hemoglobin: 13.4 g/dL (ref 13.0–17.0)
Lymphocytes Relative: 40.7 % (ref 12.0–46.0)
Lymphs Abs: 2.7 10*3/uL (ref 0.7–4.0)
MCHC: 32.7 g/dL (ref 30.0–36.0)
MCV: 89.5 fl (ref 78.0–100.0)
Monocytes Absolute: 0.5 10*3/uL (ref 0.1–1.0)
Monocytes Relative: 7.4 % (ref 3.0–12.0)
NEUTROS PCT: 45.6 % (ref 43.0–77.0)
Neutro Abs: 3.1 10*3/uL (ref 1.4–7.7)
Platelets: 360 10*3/uL (ref 150.0–400.0)
RBC: 4.6 Mil/uL (ref 4.22–5.81)
RDW: 14.1 % (ref 11.5–14.6)
WBC: 6.7 10*3/uL (ref 4.5–10.5)

## 2013-10-21 LAB — LIPID PANEL
CHOLESTEROL: 171 mg/dL (ref 0–200)
HDL: 45.1 mg/dL (ref >39.00)
LDL CALC: 110 mg/dL — AB (ref 0–99)
Total CHOL/HDL Ratio: 4
Triglycerides: 78 mg/dL (ref 0.0–149.0)
VLDL: 15.6 mg/dL (ref 0.0–40.0)

## 2013-10-21 LAB — COMPREHENSIVE METABOLIC PANEL
ALBUMIN: 4.3 g/dL (ref 3.5–5.2)
ALT: 27 U/L (ref 0–53)
AST: 21 U/L (ref 0–37)
Alkaline Phosphatase: 50 U/L (ref 39–117)
BUN: 13 mg/dL (ref 6–23)
CALCIUM: 9.5 mg/dL (ref 8.4–10.5)
CHLORIDE: 104 meq/L (ref 96–112)
CO2: 27 mEq/L (ref 19–32)
Creatinine, Ser: 0.8 mg/dL (ref 0.4–1.5)
GFR: 122.04 mL/min (ref >60.00)
Glucose, Bld: 95 mg/dL (ref 70–99)
Potassium: 3.8 mEq/L (ref 3.5–5.1)
Sodium: 140 mEq/L (ref 135–145)
Total Bilirubin: 0.6 mg/dL (ref 0.3–1.2)
Total Protein: 7.6 g/dL (ref 6.0–8.3)

## 2013-10-21 LAB — TSH: TSH: 2.21 u[IU]/mL (ref 0.35–5.50)

## 2013-10-21 LAB — URINALYSIS, ROUTINE W REFLEX MICROSCOPIC
Bilirubin Urine: NEGATIVE
Hgb urine dipstick: NEGATIVE
Ketones, ur: NEGATIVE
Leukocytes, UA: NEGATIVE
Nitrite: NEGATIVE
RBC / HPF: NONE SEEN (ref 0–?)
Specific Gravity, Urine: 1.015 (ref 1.000–1.030)
TOTAL PROTEIN, URINE-UPE24: NEGATIVE
URINE GLUCOSE: NEGATIVE
UROBILINOGEN UA: 0.2 (ref 0.0–1.0)
WBC UA: NONE SEEN (ref 0–?)
pH: 7 (ref 5.0–8.0)

## 2013-10-21 LAB — PSA: PSA: 1.22 ng/mL (ref 0.10–4.00)

## 2013-10-21 MED ORDER — AMOXICILLIN 875 MG PO TABS: 875.0000 mg | ORAL_TABLET | Freq: Two times a day (BID) | ORAL | Status: DC

## 2013-10-21 MED ORDER — TADALAFIL 20 MG PO TABS: 20.0000 mg | ORAL_TABLET | Freq: Every day | ORAL | Status: DC | PRN

## 2013-10-21 NOTE — Patient Instructions (Signed)

## 2013-10-21 NOTE — Assessment & Plan Note (Signed)
He refused a Tdap and flu vax today Exam done Labs ordered Pt ed material was given

## 2013-10-21 NOTE — Assessment & Plan Note (Signed)
I will check his labs today to screen for secondary causes He will cont taking cialis as needed

## 2013-10-21 NOTE — Assessment & Plan Note (Signed)
Will treat with amoxil 

## 2013-10-21 NOTE — Progress Notes (Signed)
Subjective:    Patient ID: Kurt Underwood, male    DOB: 1950-10-02, 63 y.o.   MRN: [id number]  Erectile Dysfunction This is a chronic problem. The current episode started more than 1 year ago. The problem is unchanged. The nature of his difficulty is achieving erection, maintaining erection and penetration. He reports no anxiety, decreased libido or performance anxiety. He reports his erection duration to be 1 to 5 minutes. Irritative symptoms do not include frequency, nocturia or urgency. Obstructive symptoms do not include dribbling, incomplete emptying, an intermittent stream, a slower stream, straining or a weak stream. Pertinent negatives include no chills, dysuria, genital pain, hematuria, hesitancy or inability to urinate. Nothing aggravates the symptoms. Past treatments include tadalafil. The treatment provided significant relief. He has had no adverse reactions caused by medications. There are no known risk factors.      Review of Systems  Constitutional: Negative.  Negative for fever, chills, diaphoresis, appetite change and fatigue.  HENT: Positive for postnasal drip, rhinorrhea and sinus pressure. Negative for nosebleeds, sneezing, sore throat, tinnitus, trouble swallowing and voice change.   Eyes: Negative.   Respiratory: Negative.  Negative for cough, choking, chest tightness, shortness of breath and stridor.   Cardiovascular: Negative.  Negative for chest pain, palpitations and leg swelling.  Gastrointestinal: Negative.   Endocrine: Negative.   Genitourinary: Negative.  Negative for dysuria, hesitancy, urgency, frequency, hematuria, decreased libido, incomplete emptying and nocturia.  Musculoskeletal: Negative.   Skin: Negative.   Allergic/Immunologic: Negative.   Neurological: Negative.  Negative for dizziness, tremors, syncope, speech difficulty, weakness, light-headedness and numbness.  Hematological: Negative.  Negative for adenopathy. Does not bruise/bleed easily.    Psychiatric/Behavioral: Negative.        Objective:   Physical Exam  Vitals reviewed. Constitutional: He is oriented to person, place, and time. He appears well-developed and well-nourished. No distress.  HENT:  Head: Normocephalic and atraumatic.  Right Ear: Hearing, tympanic membrane, external ear and ear canal normal.  Left Ear: Hearing, tympanic membrane, external ear and ear canal normal.  Nose: No mucosal edema or rhinorrhea. Right sinus exhibits no maxillary sinus tenderness and no frontal sinus tenderness. Left sinus exhibits maxillary sinus tenderness. Left sinus exhibits no frontal sinus tenderness.  Mouth/Throat: Oropharynx is clear and moist and mucous membranes are normal. Mucous membranes are not pale, not dry and not cyanotic. No oral lesions. No trismus in the jaw. No uvula swelling. No oropharyngeal exudate, posterior oropharyngeal edema, posterior oropharyngeal erythema or tonsillar abscesses.  Eyes: Conjunctivae are normal. Right eye exhibits no discharge. Left eye exhibits no discharge. No scleral icterus.  Neck: Normal range of motion. Neck supple. No JVD present. No tracheal deviation present. No thyromegaly present.  Cardiovascular: Normal rate, regular rhythm, normal heart sounds and intact distal pulses.  Exam reveals no gallop and no friction rub.   No murmur heard. Pulmonary/Chest: Effort normal and breath sounds normal. No stridor. No respiratory distress. He has no wheezes. He has no rales. He exhibits no tenderness.  Abdominal: Soft. Bowel sounds are normal. He exhibits no distension and no mass. There is no tenderness. There is no rebound and no guarding. Hernia confirmed negative in the right inguinal area and confirmed negative in the left inguinal area.  Genitourinary: Rectum normal, prostate normal, testes normal and penis normal. Rectal exam shows no external hemorrhoid, no internal hemorrhoid, no fissure, no mass, no tenderness and anal tone normal. Guaiac  negative stool. Prostate is not enlarged and not tender. Right testis shows no  mass, no swelling and no tenderness. Right testis is descended. Left testis shows no mass, no swelling and no tenderness. Left testis is descended. Circumcised. No phimosis, paraphimosis, hypospadias, penile erythema or penile tenderness. No discharge found.  Musculoskeletal: Normal range of motion. He exhibits no edema and no tenderness.  Lymphadenopathy:    He has no cervical adenopathy.       Right: No inguinal adenopathy present.       Left: No inguinal adenopathy present.  Neurological: He is oriented to person, place, and time.  Skin: Skin is warm and dry. No rash noted. He is not diaphoretic. No erythema. No pallor.  Psychiatric: He has a normal mood and affect. His behavior is normal. Judgment and thought content normal.     Lab Results  Component Value Date   WBC 6.3 07/07/2012   HGB 14.2 07/07/2012   HCT 42.6 07/07/2012   PLT 304.0 07/07/2012   GLUCOSE 98 07/07/2012   CHOL 181 07/07/2012   TRIG 47.0 07/07/2012   HDL 48.40 07/07/2012   LDLCALC 123* 07/07/2012   ALT 26 07/07/2012   AST 26 07/07/2012   NA 140 07/07/2012   K 4.2 07/07/2012   CL 104 07/07/2012   CREATININE 0.7 07/07/2012   BUN 12 07/07/2012   CO2 29 07/07/2012   TSH 1.66 07/07/2012   PSA 0.80 07/07/2012       Assessment & Plan:

## 2014-06-02 IMAGING — CR DG LUMBAR SPINE COMPLETE 4+V
5 series · 5 of 5 positions shown · non-contrast
Comparison: None.

CLINICAL DATA: Low back pain

LUMBAR SPINE - COMPLETE 4+ VIEW

[view not recorded (1 of 5)]
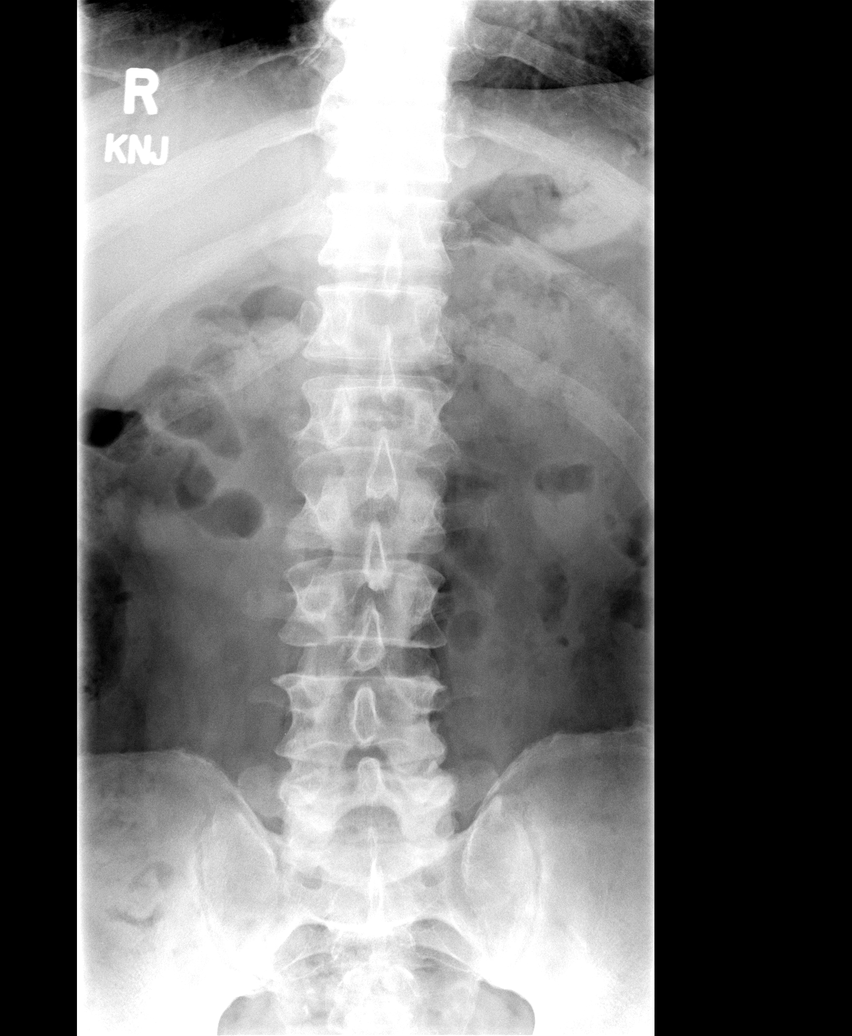

[view not recorded (2 of 5)]
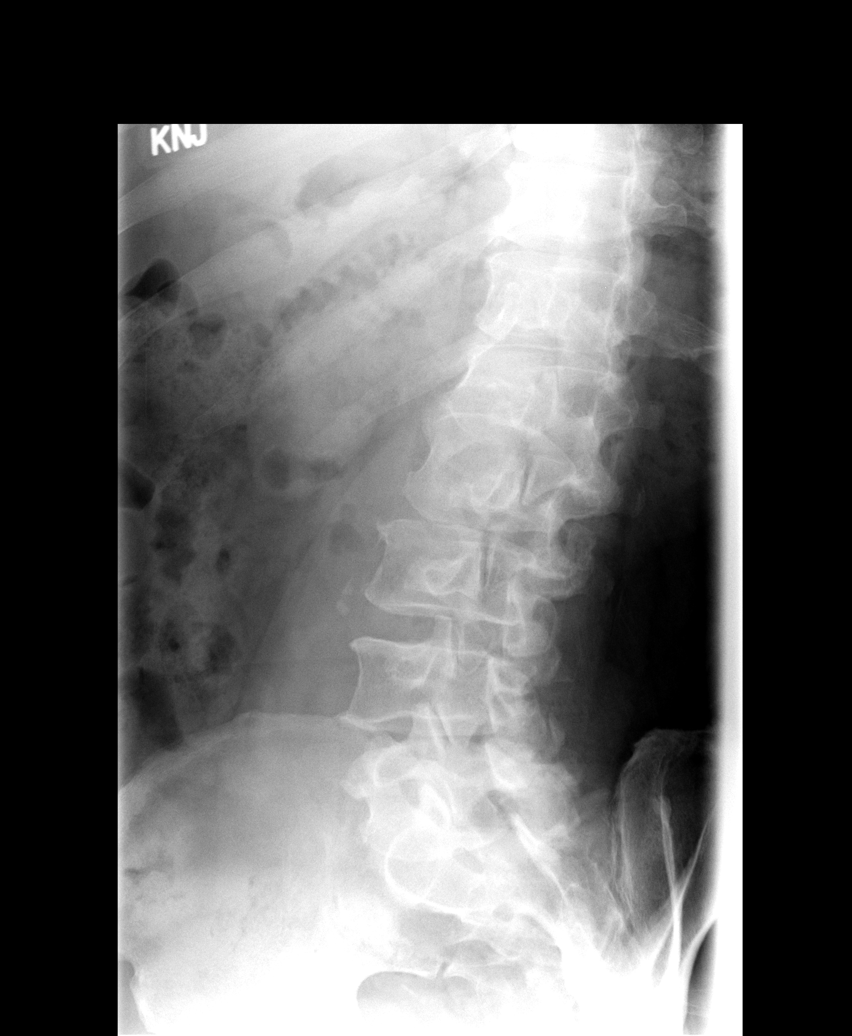

[view not recorded (3 of 5)]
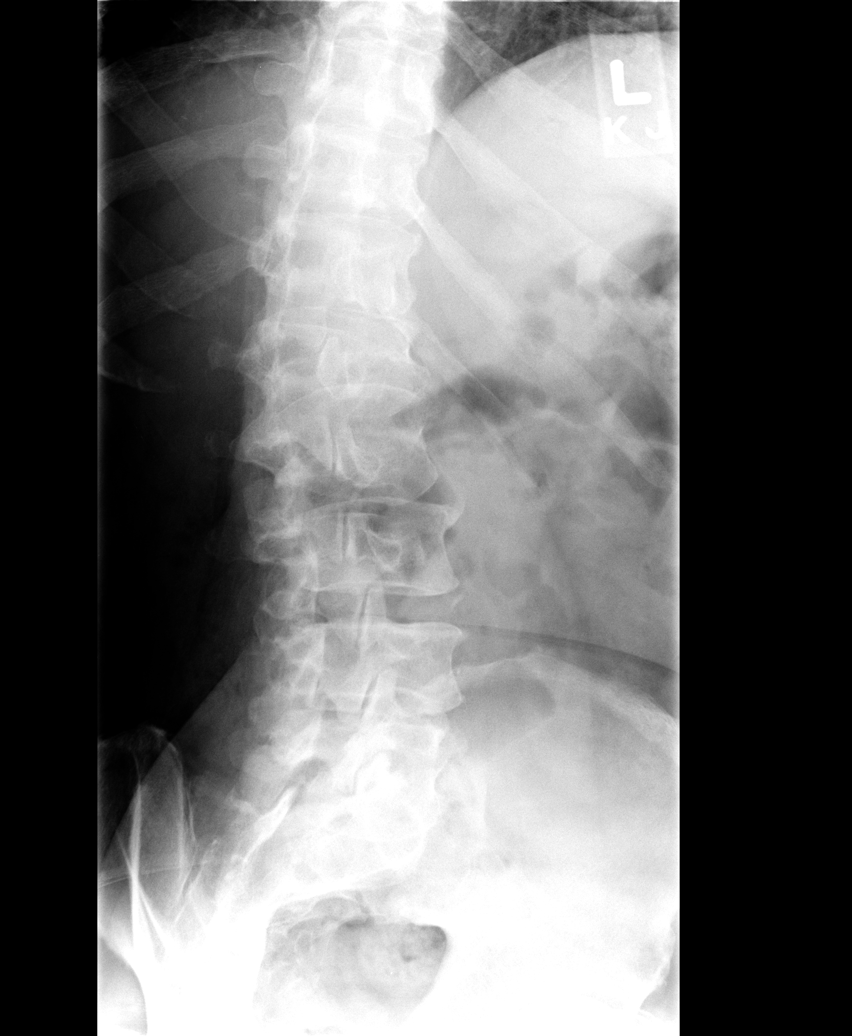

[view not recorded (4 of 5)]
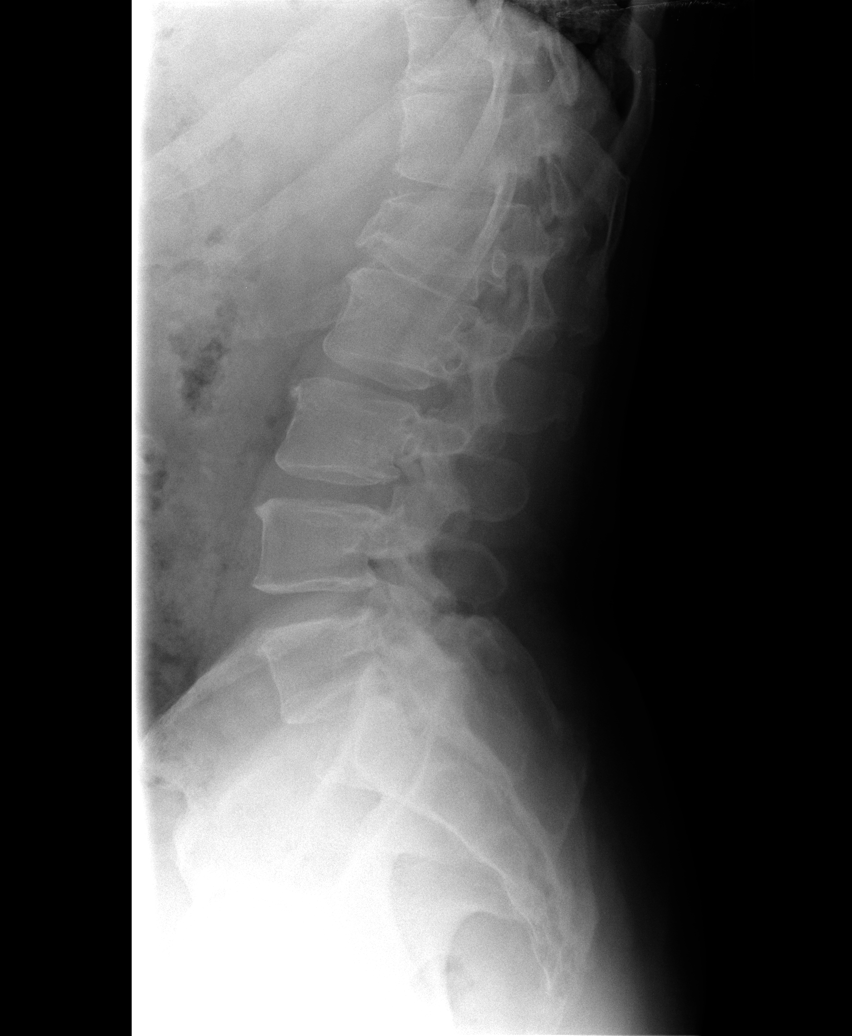

[view not recorded (5 of 5)]
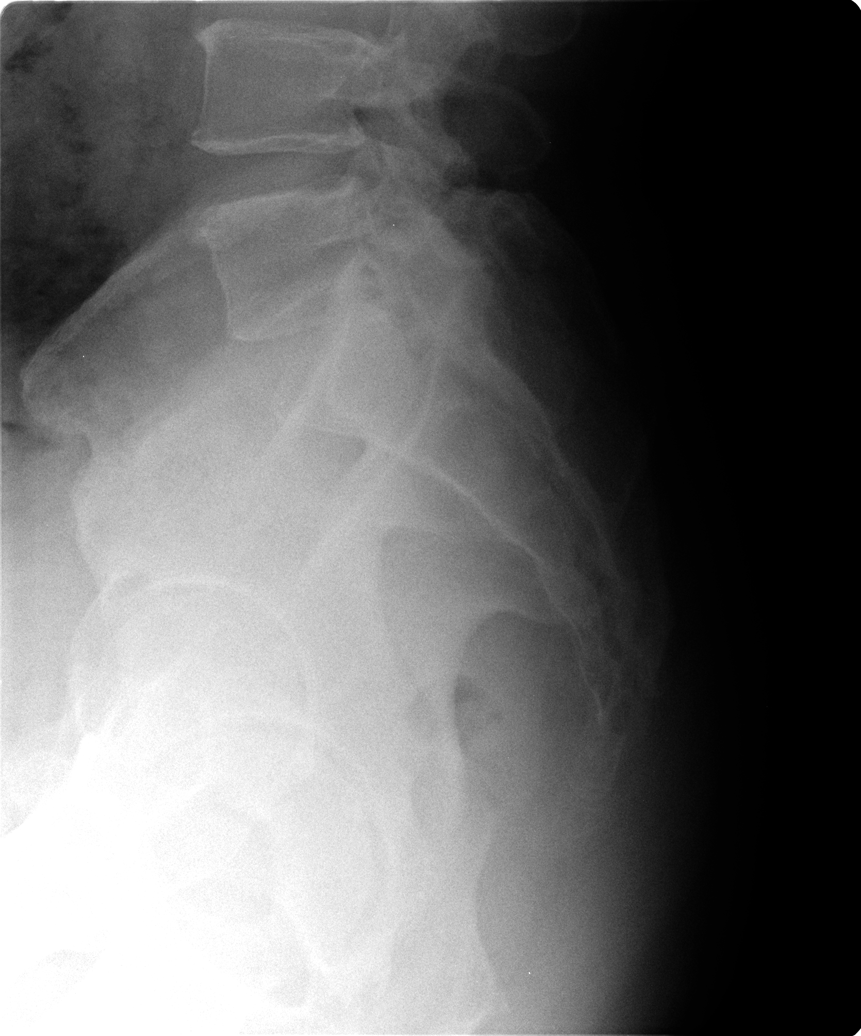

[5 of 5 positions shown; findings below may reference images not displayed]

FINDINGS: Five views of the lumbar spine submitted.  There is mild
compression deformity superior endplate of the L1 vertebral body of
indeterminate age.  Clinical correlation is necessary.  Mild disc
space flattening with mild anterior spurring at L1 and L2 level.
Mild anterior spurring upper endplate of the L3-L4 and L5 vertebral
body.
IMPRESSION: Mild compression deformity superior endplate of the L1 vertebral
body of indeterminate age.  Clinical correlation is necessary.
Mild disc space flattening with mild anterior spurring at L1 and L2
level.  Mild anterior spurring upper endplate of the L3, L4 and L5
vertebral body.

## 2014-09-22 ENCOUNTER — Ambulatory Visit (INDEPENDENT_AMBULATORY_CARE_PROVIDER_SITE_OTHER): Payer: BLUE CROSS/BLUE SHIELD | Admitting: Pulmonary Disease

## 2014-09-22 ENCOUNTER — Encounter: Payer: Self-pay | Admitting: Pulmonary Disease

## 2014-09-22 VITALS — BP 126/74 | HR 64 | Temp 97.0°F | Ht 71.0 in | Wt 212.2 lb

## 2014-09-22 DIAGNOSIS — G4733 Obstructive sleep apnea (adult) (pediatric): Secondary | ICD-10-CM

## 2014-09-22 NOTE — Assessment & Plan Note (Signed)
The patient is doing very well with C Pap, and has lost 16 pounds since the last visit. I have asked him to continue on his device, and to keep working on modest weight loss. I will see him back in one year if doing well.

## 2014-09-22 NOTE — Progress Notes (Signed)
   Subjective:    Patient ID: Kurt Underwood, male    DOB: [birth year], 64 y.o.   MRN: [id number]  HPI The patient comes in today for follow-up of his obstructive sleep apnea. He is wearing C Pap compliantly, and feels that he sleeps well during the night with excellent daytime alertness. He is having no issues with his mask fit or pressure. He has lost 16 pounds since his last visit.   Review of Systems  Constitutional: Negative for fever and unexpected weight change.  HENT: Negative for congestion, dental problem, ear pain, nosebleeds, postnasal drip, rhinorrhea, sinus pressure, sneezing, sore throat and trouble swallowing.   Eyes: Negative for redness and itching.  Respiratory: Negative for cough, chest tightness, shortness of breath and wheezing.   Cardiovascular: Negative for palpitations and leg swelling.  Gastrointestinal: Negative for nausea and vomiting.  Genitourinary: Negative for dysuria.  Musculoskeletal: Negative for joint swelling.  Skin: Negative for rash.  Neurological: Negative for headaches.  Hematological: Does not bruise/bleed easily.  Psychiatric/Behavioral: Negative for dysphoric mood. The patient is not nervous/anxious.        Objective:   Physical Exam Well-developed male in no acute distress Nose without purulence or discharge noted No skin breakdown or pressure necrosis from the C Pap mask Neck without lymphadenopathy or thyromegaly Lower extremities without edema, no cyanosis Alert and oriented, moves all 4 extremities.       Assessment & Plan:

## 2014-09-22 NOTE — Patient Instructions (Signed)
Stay on cpap, and keep up with mask changes and supplies. Work on modest weight loss followup with me again in one year.

## 2015-06-07 ENCOUNTER — Encounter: Payer: Self-pay | Admitting: Internal Medicine

## 2015-06-07 ENCOUNTER — Ambulatory Visit (INDEPENDENT_AMBULATORY_CARE_PROVIDER_SITE_OTHER): Payer: BLUE CROSS/BLUE SHIELD | Admitting: Internal Medicine

## 2015-06-07 VITALS — BP 140/80 | HR 76 | Temp 98.7°F | Resp 16 | Wt 212.0 lb

## 2015-06-07 DIAGNOSIS — N529 Male erectile dysfunction, unspecified: Secondary | ICD-10-CM

## 2015-06-07 MED ORDER — TADALAFIL 20 MG PO TABS: 20.0000 mg | ORAL_TABLET | Freq: Every day | ORAL | Status: DC | PRN

## 2015-06-07 NOTE — Progress Notes (Signed)
Pre visit review using our clinic review tool, if applicable. No additional management support is needed unless otherwise documented below in the visit note. 

## 2015-06-07 NOTE — Progress Notes (Signed)
   Subjective:    Patient ID: Kurt Underwood, male    DOB: 03/29/1951, 64 y.o.   MRN: [id number]  HPI He is here for refill of Cialis; he takes this "once a month" on average. He does not want "a physical".  He is on a heart healthy diet. He does calisthentics and lifts weights for 30 minutes several times a week. He has no associated cardiopulmonary symptoms. He specifically denies chest pain and  taking nitroglycerin.  He does not smoke or drink.  He denies decreased libido or generalized weakness. His main issue is difficulty getting an erection or keeping erection.  The chart was reviewed; on 10/21/13 his LDL was 110 and HDL 45.1. Triglycerides were 78. Glucose was 95.   Review of Systems Chest pain, palpitations, tachycardia, exertional dyspnea, paroxysmal nocturnal dyspnea, claudication or edema are absent.      Objective:   Physical Exam Pertinent or positive findings include: There is slight asymmetry to his smile with decreased left nasolabial fold.  General appearance :adequately nourished; in no distress.  Eyes: No conjunctival inflammation or scleral icterus is present.  Oral exam:  Lips and gums are healthy appearing.There is no oropharyngeal erythema or exudate noted. Dental hygiene is good.  Heart:  Normal rate and regular rhythm. S1 and S2 normal without gallop, murmur, click, rub or other extra sounds    Lungs:Chest clear to auscultation; no wheezes, rhonchi,rales ,or rubs present.No increased work of breathing.   Abdomen: bowel sounds normal, soft and non-tender without masses, organomegaly or hernias noted.  No guarding or rebound.  Vascular : all pulses equal ; no bruits present.  Skin:Warm & dry.  Intact without suspicious lesions or rashes ; no tenting  Lymphatic: No lymphadenopathy is noted about the head, neck, axilla.   Neuro: Strength, tone & DTRs normal.      Assessment & Plan:  #1 erectile dysfunction with no clinical history to suggest  testosterone deficiency  Plan: Cialis No. 20, refill 1

## 2015-06-08 NOTE — Patient Instructions (Signed)
Cardiovascular exercise, this can be as simple a program as walking, is recommended 30-45 minutes 3-4 times per week.

## 2015-06-13 ENCOUNTER — Ambulatory Visit: Payer: BLUE CROSS/BLUE SHIELD | Admitting: Family

## 2015-09-29 ENCOUNTER — Ambulatory Visit: Payer: BLUE CROSS/BLUE SHIELD | Admitting: Pulmonary Disease

## 2015-11-02 DIAGNOSIS — S61217A Laceration without foreign body of left little finger without damage to nail, initial encounter: Secondary | ICD-10-CM | POA: Diagnosis not present

## 2015-11-02 DIAGNOSIS — Z23 Encounter for immunization: Secondary | ICD-10-CM | POA: Diagnosis not present

## 2015-11-02 DIAGNOSIS — S63281A Dislocation of proximal interphalangeal joint of left index finger, initial encounter: Secondary | ICD-10-CM | POA: Diagnosis not present

## 2015-11-02 DIAGNOSIS — S62631A Displaced fracture of distal phalanx of left index finger, initial encounter for closed fracture: Secondary | ICD-10-CM | POA: Diagnosis not present

## 2015-11-02 DIAGNOSIS — S61213A Laceration without foreign body of left middle finger without damage to nail, initial encounter: Secondary | ICD-10-CM | POA: Diagnosis not present

## 2015-11-02 DIAGNOSIS — S63283A Dislocation of proximal interphalangeal joint of left middle finger, initial encounter: Secondary | ICD-10-CM | POA: Diagnosis not present

## 2015-11-02 DIAGNOSIS — S62631B Displaced fracture of distal phalanx of left index finger, initial encounter for open fracture: Secondary | ICD-10-CM | POA: Diagnosis not present

## 2015-11-02 DIAGNOSIS — S61211A Laceration without foreign body of left index finger without damage to nail, initial encounter: Secondary | ICD-10-CM | POA: Diagnosis not present

## 2015-11-02 DIAGNOSIS — S61215A Laceration without foreign body of left ring finger without damage to nail, initial encounter: Secondary | ICD-10-CM | POA: Diagnosis not present

## 2015-11-02 DIAGNOSIS — S61218A Laceration without foreign body of other finger without damage to nail, initial encounter: Secondary | ICD-10-CM | POA: Diagnosis not present

## 2015-11-11 DIAGNOSIS — S61402D Unspecified open wound of left hand, subsequent encounter: Secondary | ICD-10-CM | POA: Diagnosis not present

## 2015-11-11 DIAGNOSIS — Z4802 Encounter for removal of sutures: Secondary | ICD-10-CM | POA: Diagnosis not present

## 2016-08-02 ENCOUNTER — Ambulatory Visit (INDEPENDENT_AMBULATORY_CARE_PROVIDER_SITE_OTHER): Payer: Medicare Other | Admitting: Internal Medicine

## 2016-08-02 ENCOUNTER — Encounter: Payer: Self-pay | Admitting: Internal Medicine

## 2016-08-02 VITALS — BP 128/88 | HR 71 | Temp 98.2°F | Ht 71.0 in | Wt 218.8 lb

## 2016-08-02 DIAGNOSIS — N529 Male erectile dysfunction, unspecified: Secondary | ICD-10-CM | POA: Diagnosis not present

## 2016-08-02 DIAGNOSIS — N5201 Erectile dysfunction due to arterial insufficiency: Secondary | ICD-10-CM | POA: Diagnosis not present

## 2016-08-02 MED ORDER — TADALAFIL 20 MG PO TABS: 20.0000 mg | ORAL_TABLET | Freq: Every day | ORAL | 3 refills | Status: DC | PRN

## 2016-08-02 NOTE — Patient Instructions (Signed)
Erectile Dysfunction Erectile dysfunction is the inability to get or sustain a good enough erection to have sexual intercourse. Erectile dysfunction may involve:  Inability to get an erection.  Lack of enough hardness to allow penetration.  Loss of the erection before sex is finished.  Premature ejaculation. CAUSES  Certain drugs, such as:  Pain relievers.  Antihistamines.  Antidepressants.  Blood pressure medicines.  Water pills (diuretics).  Ulcer medicines.  Muscle relaxants.  Illegal drugs.  Excessive drinking.  Psychological causes, such as:  Anxiety.  Depression.  Sadness.  Exhaustion.  Performance fear.  Stress.  Physical causes, such as:  Artery problems. This may include diabetes, smoking, liver disease, or atherosclerosis.  High blood pressure.  Hormonal problems, such as low testosterone.  Obesity.  Nerve problems. This may include back or pelvic injuries, diabetes mellitus, multiple sclerosis, or Parkinson disease. SYMPTOMS  Inability to get an erection.  Lack of enough hardness to allow penetration.  Loss of the erection before sex is finished.  Premature ejaculation.  Normal erections at some times, but with frequent unsatisfactory episodes.  Orgasms that are not satisfactory in sensation or frequency.  Low sexual satisfaction in either partner because of erection problems.  A curved penis occurring with erection. The curve may cause pain or may be too curved to allow for intercourse.  Never having nighttime erections. DIAGNOSIS Your caregiver can often diagnose this condition by:  Performing a physical exam to find other diseases or specific problems with the penis.  Asking you detailed questions about the problem.  Performing blood tests to check for diabetes mellitus or to measure hormone levels.  Performing urine tests to find other underlying health conditions.  Performing an ultrasound exam to check for  scarring.  Performing a test to check blood flow to the penis.  Doing a sleep study at home to measure nighttime erections. TREATMENT   You may be prescribed medicines by mouth.  You may be given medicine injections into the penis.  You may be prescribed a vacuum pump with a ring.  Penile implant surgery may be performed. You may receive:  An inflatable implant.  A semirigid implant.  Blood vessel surgery may be performed. HOME CARE INSTRUCTIONS  If you are prescribed oral medicine, you should take the medicine as prescribed. Do not increase the dosage without first discussing it with your physician.  If you are using self-injections, be careful to avoid any veins that are on the surface of the penis. Apply pressure to the injection site for 5 minutes.  If you are using a vacuum pump, make sure you have read the instructions before using it. Discuss any questions with your physician before taking the pump home. SEEK MEDICAL CARE IF:  You experience pain that is not responsive to the pain medicine you have been prescribed.  You experience nausea or vomiting. SEEK IMMEDIATE MEDICAL CARE IF:   When taking oral or injectable medications, you experience an erection that lasts longer than 4 hours. If your physician is unavailable, go to the nearest emergency room for evaluation. An erection that lasts much longer than 4 hours can result in permanent damage to your penis.  You have pain that is severe.  You develop redness, severe pain, or severe swelling of your penis.  You have redness spreading up into your groin or lower abdomen.  You are unable to pass your urine. This information is not intended to replace advice given to you by your health care provider. Make sure you   discuss any questions you have with your health care provider. Document Released: 08/17/2000 Document Revised: 04/22/2013 Document Reviewed: 01/22/2013 Elsevier Interactive Patient Education  2017 [employer]  Inc[employer]  

## 2016-08-02 NOTE — Progress Notes (Signed)
Subjective:  Patient ID: Kurt Underwood, male    DOB: 12-31-1950  Age: [age] y.o. MRN: [id number]  CC: Medication Refill   HPI Kurt Underwood presents for refill on medication for erectile dysfunction. He has not had a complete physical or labs done in several years. He is refusing to do so today. Other than the ED, he feels well and offers no complaints.  Outpatient Medications Prior to Visit  Medication Sig Dispense Refill  . Multiple Vitamins-Minerals (MULTIVITAMIN PO) Take 1 tablet by mouth daily.    . tadalafil (CIALIS) 20 MG tablet Take 1 tablet (20 mg total) by mouth daily as needed for erectile dysfunction. 20 tablet 1   No facility-administered medications prior to visit.     ROS Review of Systems  Constitutional: Negative for appetite change, diaphoresis, fatigue and unexpected weight change.  HENT: Negative.   Eyes: Negative for visual disturbance.  Respiratory: Negative for cough, chest tightness, shortness of breath and stridor.   Cardiovascular: Negative for chest pain, palpitations and leg swelling.  Gastrointestinal: Negative.  Negative for abdominal pain, constipation, diarrhea, nausea and vomiting.  Endocrine: Negative.   Genitourinary: Negative.  Negative for difficulty urinating.  Musculoskeletal: Negative.  Negative for back pain, myalgias and neck pain.  Skin: Negative.  Negative for color change and rash.  Allergic/Immunologic: Negative.   Neurological: Negative.  Negative for dizziness, weakness and light-headedness.  Hematological: Negative.  Negative for adenopathy. Does not bruise/bleed easily.  Psychiatric/Behavioral: Negative.     Objective:  BP 128/88 (BP Location: Right Arm, Patient Position: Sitting, Cuff Size: Large)   Pulse 71   Temp 98.2 F (36.8 C) (Oral)   Ht 5\' 11"  (1.803 m)   Wt 218 lb 12.8 oz (99.2 kg)   SpO2 96%   BMI 30.52 kg/m   BP Readings from Last 3 Encounters:  08/02/16 128/88  06/07/15 140/80  09/22/14 126/74    Wt  Readings from Last 3 Encounters:  08/02/16 218 lb 12.8 oz (99.2 kg)  06/07/15 212 lb (96.2 kg)  09/22/14 212 lb 3.2 oz (96.3 kg)    Physical Exam  Constitutional: He is oriented to person, place, and time. No distress.  HENT:  Mouth/Throat: Oropharynx is clear and moist. No oropharyngeal exudate.  Eyes: Conjunctivae are normal. Right eye exhibits no discharge. Left eye exhibits no discharge. No scleral icterus.  Neck: Normal range of motion. Neck supple. No JVD present. No tracheal deviation present. No thyromegaly present.  Cardiovascular: Normal rate, regular rhythm, normal heart sounds and intact distal pulses.  Exam reveals no gallop and no friction rub.   No murmur heard. Pulmonary/Chest: Effort normal and breath sounds normal. No stridor. No respiratory distress. He has no wheezes. He has no rales. He exhibits no tenderness.  Abdominal: Soft. Bowel sounds are normal. He exhibits no distension and no mass. There is no tenderness. There is no rebound and no guarding.  Musculoskeletal: Normal range of motion. He exhibits no edema or tenderness.  Lymphadenopathy:    He has no cervical adenopathy.  Neurological: He is oriented to person, place, and time.  Skin: Skin is warm and dry. No rash noted. He is not diaphoretic. No erythema. No pallor.  Vitals reviewed.   Lab Results  Component Value Date   WBC 6.7 10/21/2013   HGB 13.4 10/21/2013   HCT 41.1 10/21/2013   PLT 360.0 10/21/2013   GLUCOSE 95 10/21/2013   CHOL 171 10/21/2013   TRIG 78.0 10/21/2013   HDL 45.10 10/21/2013  LDLCALC 110 (H) 10/21/2013   ALT 27 10/21/2013   AST 21 10/21/2013   NA 140 10/21/2013   K 3.8 10/21/2013   CL 104 10/21/2013   CREATININE 0.8 10/21/2013   BUN 13 10/21/2013   CO2 27 10/21/2013   TSH 2.21 10/21/2013   PSA 1.22 10/21/2013    Dg Lumbar Spine Complete  Result Date: 07/07/2012 *RADIOLOGY REPORT* Clinical Data: Low back pain LUMBAR SPINE - COMPLETE 4+ VIEW Comparison: None. Findings:  Five views of the lumbar spine submitted.  There is mild compression deformity superior endplate of the L1 vertebral body of indeterminate age.  Clinical correlation is necessary.  Mild disc space flattening with mild anterior spurring at L1 and L2 level. Mild anterior spurring upper endplate of the L3-L4 and L5 vertebral body. IMPRESSION: Mild compression deformity superior endplate of the L1 vertebral body of indeterminate age.  Clinical correlation is necessary. Mild disc space flattening with mild anterior spurring at L1 and L2 level.  Mild anterior spurring upper endplate of the L3, L4 and L5 vertebral body. Original Report Authenticated By: Natasha MeadLiviu Pop, M.D.    Assessment & Plan:   Kurt Underwood was seen today for medication refill.  Diagnoses and all orders for this visit:  Erectile dysfunction due to arterial insufficiency  Erectile dysfunction, unspecified erectile dysfunction type- I refilled his Cialis today but I told him I will no longer offer anymore prescriptions until he has a complete physical with labs. -     tadalafil (CIALIS) 20 MG tablet; Take 1 tablet (20 mg total) by mouth daily as needed for erectile dysfunction.   I have discontinued Mr. Kurt Underwood's Multiple Vitamins-Minerals (MULTIVITAMIN PO). I am also having him maintain his tadalafil.  Meds ordered this encounter  Medications  . tadalafil (CIALIS) 20 MG tablet    Sig: Take 1 tablet (20 mg total) by mouth daily as needed for erectile dysfunction.    Dispense:  20 tablet    Refill:  3     Follow-up: Return in about 3 months (around 10/31/2016).  Kurt Lingerhomas Jones, MD

## 2018-08-21 ENCOUNTER — Encounter: Payer: Self-pay | Admitting: Family Medicine

## 2018-08-21 ENCOUNTER — Ambulatory Visit (INDEPENDENT_AMBULATORY_CARE_PROVIDER_SITE_OTHER): Payer: Medicare Other | Admitting: Family Medicine

## 2018-08-21 ENCOUNTER — Other Ambulatory Visit: Payer: Medicare Other

## 2018-08-21 ENCOUNTER — Other Ambulatory Visit: Payer: Self-pay | Admitting: Family Medicine

## 2018-08-21 VITALS — BP 144/92 | HR 113 | Temp 98.1°F | Ht 71.0 in | Wt 218.0 lb

## 2018-08-21 DIAGNOSIS — Z113 Encounter for screening for infections with a predominantly sexual mode of transmission: Secondary | ICD-10-CM

## 2018-08-21 DIAGNOSIS — B009 Herpesviral infection, unspecified: Secondary | ICD-10-CM

## 2018-08-21 DIAGNOSIS — L299 Pruritus, unspecified: Secondary | ICD-10-CM | POA: Diagnosis not present

## 2018-08-21 MED ORDER — NYSTATIN-TRIAMCINOLONE 100000-0.1 UNIT/GM-% EX OINT: 1.0000 "application " | TOPICAL_OINTMENT | Freq: Two times a day (BID) | CUTANEOUS | 0 refills | Status: DC

## 2018-08-21 NOTE — Progress Notes (Signed)
Patient ID: Kurt Underwood, male   DOB: 05-11-1951, 67 y.o.   MRN: [id number]

## 2018-08-21 NOTE — Patient Instructions (Signed)
Please go to lab before you leave.  The cream can be used sparingly on affected area on leg. Please keep area clean and dry. If no improvement, follow up with your provider.  You are due for follow up with your provider for routine care and labs.

## 2018-08-21 NOTE — Progress Notes (Signed)
Subjective:    Patient ID: Kurt Underwood, male    DOB: 09/22/1950, 67 y.o.   MRN: [id number]  HPI  Mr. Kurt Underwood is a 67 year old male who presents today with itching in his groin for two weeks. He denies any lesions or rash in this area. He states he would like to be evaluated for STIs today as he has had two new partners in the past 3 months. He denies any urethral discharge or ulcerations.   Partners: New partners: Yes, 2 or 3 months ago. Multiple partners: Two   Practices: Sexual intercourse   Practices: Condom Use: Yes but reports inconsistent use of condoms.   Past History: History of STIs: Remote history of chlamydia per patient.  He has not had a complete physical or labs in several years and is refusing to do this at this time. His PCP advised physical and labs however patient has not completed this as advised.     Review of Systems  Constitutional: Negative for chills, fatigue and fever.  Respiratory: Negative for cough, shortness of breath and wheezing.   Cardiovascular: Negative for chest pain and palpitations.  Gastrointestinal: Negative for abdominal pain.  Genitourinary: Negative for discharge, dysuria, frequency, genital sores, hematuria, penile pain, penile swelling, scrotal swelling, testicular pain and urgency.  Musculoskeletal: Negative for arthralgias.  Skin: Negative for rash.  Psychiatric/Behavioral:       Denies depressed or anxious mood   Past Medical History:  Diagnosis Date  . Arthritis   . OSA (obstructive sleep apnea)      Social History   Socioeconomic History  . Marital status: Married    Spouse name: Not on file  . Number of children: Y  . Years of education: Not on file  . Highest education level: Not on file  Occupational History  . 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 Financial resource strain: Not on file  . Food insecurity:    Worry: Not on file    Inability: Not on file  . Transportation needs:    Medical: Not on file     Non-medical: Not on file  Tobacco Use  . Smoking status: Never Smoker  . Smokeless tobacco: Never Used  Substance and Sexual Activity  . Alcohol use: No  . Drug use: No  . Sexual activity: Yes  Lifestyle  . Physical activity:    Days per week: Not on file    Minutes per session: Not on file  . Stress: Not on file  Relationships  . Social connections:    Talks on phone: Not on file    Gets together: Not on file    Attends religious service: Not on file    Active member of club or organization: Not on file    Attends meetings of clubs or organizations: Not on file    Relationship status: Not on file  . Intimate partner violence:    Fear of current or ex partner: Not on file    Emotionally abused: Not on file    Physically abused: Not on file    Forced sexual activity: Not on file  Other Topics Concern  . Not on file  Social History Narrative  . Not on file    Past Surgical History:  Procedure Laterality Date  . WRIST SURGERY     Right    Family History  Problem Relation Age of Onset  . Hypertension Mother   . Hypertension Father   . Cancer Brother   .  Alcohol abuse Brother   . Arthritis Other   . Hypertension Other   . Early death Neg Hx   . Heart disease Neg Hx   . Hyperlipidemia Neg Hx   . Kidney disease Neg Hx   . Stroke Neg Hx     No Known Allergies  Current Outpatient Medications on File Prior to Visit  Medication Sig Dispense Refill  . tadalafil (CIALIS) 20 MG tablet Take 1 tablet (20 mg total) by mouth daily as needed for erectile dysfunction. (Patient not taking: Reported on 08/21/2018) 20 tablet 3   No current facility-administered medications on file prior to visit.     BP (!) 144/92 (BP Location: Left Arm, Patient Position: Sitting, Cuff Size: Large)   Pulse (!) 113   Temp 98.1 F (36.7 C) (Oral)   Ht 5\' 11"  (1.803 m)   Wt 218 lb (98.9 kg)   SpO2 97%   BMI 30.40 kg/m   Retake of HR:  99     Objective:   Physical  Exam Constitutional:      Appearance: Normal appearance.  Eyes:     General: No scleral icterus.    Pupils: Pupils are equal, round, and reactive to light.  Neck:     Musculoskeletal: Neck supple.  Cardiovascular:     Rate and Rhythm: Normal rate and regular rhythm.     Pulses: Normal pulses.  Pulmonary:     Breath sounds: Normal breath sounds.  Abdominal:     General: Abdomen is flat. Bowel sounds are normal.     Tenderness: There is no abdominal tenderness.  Genitourinary:    Penis: Normal.      Scrotum/Testes: Normal.  Lymphadenopathy:     Cervical: No cervical adenopathy.  Skin:    General: Skin is warm and dry.     Capillary Refill: Capillary refill takes less than 2 seconds.     Findings: No erythema or rash.  Neurological:     Mental Status: He is alert.  Psychiatric:        Mood and Affect: Mood normal.        Behavior: Behavior normal.        Thought Content: Thought content normal.        Judgment: Judgment normal.        Assessment & Plan:  1. Routine screening for STI (sexually transmitted infection) Inconsistent condom use present. No history of exposure however new partners within the last 60 days. No symptoms present. Patient would like screening and is not interested in empiric treatment. Reviewed safe sex practices and also discussed symptoms of STIs today. Offered HIV however a waiver with cost associated was needed and patient declined this today.  - GC/Chlamydia Probe Amp([facility]); Future - RPR; Future - Hepatitis B surface antigen; Future - Hepatitis B surface antibody,qualitative; Future  2. Itching No rash or lesions in groin area are present today. We discussed that this may be due to moisture and the importance of keeping the area clean/dry. He will try mycolog for symptoms as itching is noted on inner thighs today. If no improvement, will follow up with PCP for further evaluation and treatment. - nystatin-triamcinolone ointment (MYCOLOG); Apply  1 application topically 2 (two) times daily.  Dispense: 30 g; Refill: 0  Advised patient the importance of scheduling a physical and lab work as it has been several years and this is needed. We further discussed that BP is mildly elevated and this should also be reassessed. He is not interested  in further evaluation today. Also offered empiric therapy for STIs today however he declined.  Roddie McJulia Alitza Cowman, FNP-C

## 2018-08-21 NOTE — Progress Notes (Signed)
HSV lab work added per patient request for STI screening. Patient would like to know if he has been exposed in the past.

## 2018-08-22 LAB — TEST AUTHORIZATION: TEST CODE:: 3636

## 2018-08-22 LAB — HEPATITIS B SURFACE ANTIGEN: HEP B S AG: NONREACTIVE

## 2018-08-22 LAB — RPR: RPR: NONREACTIVE

## 2018-08-22 LAB — TIQ-MISC

## 2018-08-22 LAB — HEPATITIS B SURFACE ANTIBODY,QUALITATIVE: Hep B S Ab: NONREACTIVE

## 2018-08-22 LAB — HSV 1 ANTIBODY, IGG: HSV 1 Glycoprotein G Ab, IgG: 3.1 index — ABNORMAL HIGH

## 2018-08-24 LAB — GC/CHLAMYDIA PROBE AMP
Chlamydia trachomatis, NAA: NEGATIVE
NEISSERIA GONORRHOEAE BY PCR: NEGATIVE

## 2018-09-05 ENCOUNTER — Telehealth: Payer: Self-pay

## 2018-09-05 NOTE — Telephone Encounter (Signed)
Copied from CRM [id number]. Topic: General - Other >> Sep 02, 2018  5:03 PM Darletta Moll L wrote: Reason for CRM: Patient would like a call back on RE results from 12/19. Some have not been resulted yet?

## 2018-09-05 NOTE — Telephone Encounter (Signed)
Kurt Underwood contacted pt but there are no notes on the exactly what the results are. Can you result (labs from 08/21/18) them to me please? Ie: is the HSV positive?   Pt is calling back for them.

## 2018-09-05 NOTE — Telephone Encounter (Signed)
Spoke to Dr. 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

## 2018-10-23 ENCOUNTER — Other Ambulatory Visit: Payer: Self-pay | Admitting: Family Medicine

## 2019-04-01 ENCOUNTER — Other Ambulatory Visit: Payer: Self-pay

## 2019-11-06 DIAGNOSIS — Z23 Encounter for immunization: Secondary | ICD-10-CM | POA: Diagnosis not present

## 2019-12-04 DIAGNOSIS — Z23 Encounter for immunization: Secondary | ICD-10-CM | POA: Diagnosis not present

## 2020-07-27 DIAGNOSIS — Z23 Encounter for immunization: Secondary | ICD-10-CM | POA: Diagnosis not present

## 2021-05-04 ENCOUNTER — Ambulatory Visit: Payer: Medicare Other | Admitting: Internal Medicine

## 2021-06-21 ENCOUNTER — Telehealth: Payer: Self-pay | Admitting: Pulmonary Disease

## 2021-06-21 NOTE — Telephone Encounter (Signed)
Patient scheduled 07/07/21 for sleep consult with Dr. Wynona Neat. Patient sleep study 11/11/01 printed. Patient DME was lasted listed as [employer]. ATC Patient and LM to bring SD card to [facility].

## 2021-07-07 ENCOUNTER — Ambulatory Visit (INDEPENDENT_AMBULATORY_CARE_PROVIDER_SITE_OTHER): Payer: Medicare Other | Admitting: Pulmonary Disease

## 2021-07-07 ENCOUNTER — Encounter: Payer: Self-pay | Admitting: Pulmonary Disease

## 2021-07-07 ENCOUNTER — Other Ambulatory Visit: Payer: Self-pay

## 2021-07-07 VITALS — BP 112/78 | HR 56 | Temp 97.7°F | Ht 71.0 in | Wt 218.8 lb

## 2021-07-07 DIAGNOSIS — G4733 Obstructive sleep apnea (adult) (pediatric): Secondary | ICD-10-CM

## 2021-07-07 NOTE — Patient Instructions (Signed)
We will contact medical supply company  Prescription for CPAP of 15 with heated humidification  You may require a repeat sleep study to confirm you still have significant sleep apnea in which case you will hear from our office to get a sleep study scheduled  Continue using current CPAP  Call with significant concerns  Tentative follow-up 3 to 4 monthsSleep Apnea Sleep apnea affects breathing during sleep. It causes breathing to stop for 10 seconds or more, or to become shallow. People with sleep apnea usually snore loudly. It can also increase the risk of: Heart attack. Stroke. Being very overweight (obese). Diabetes. Heart failure. Irregular heartbeat. High blood pressure. The goal of treatment is to help you breathe normally again. What are the causes? The most common cause of this condition is a collapsed or blocked airway. There are three kinds of sleep apnea: Obstructive sleep apnea. This is caused by a blocked or collapsed airway. Central sleep apnea. This happens when the brain does not send the right signals to the muscles that control breathing. Mixed sleep apnea. This is a combination of obstructive and central sleep apnea. What increases the risk? Being overweight. Smoking. Having a small airway. Being older. Being male. Drinking alcohol. Taking medicines to calm yourself (sedatives or tranquilizers). Having family members with the condition. Having a tongue or tonsils that are larger than normal. What are the signs or symptoms? Trouble staying asleep. Loud snoring. Headaches in the morning. Waking up gasping. Dry mouth or sore throat in the morning. Being sleepy or tired during the day. If you are sleepy or tired during the day, you may also: Not be able to focus your mind (concentrate). Forget things. Get angry a lot and have mood swings. Feel sad (depressed). Have changes in your personality. Have less interest in sex, if you are male. Be unable to  have an erection, if you are male. How is this treated?  Sleeping on your side. Using a medicine to get rid of mucus in your nose (decongestant). Avoiding the use of alcohol, medicines to help you relax, or certain pain medicines (narcotics). Losing weight, if needed. Changing your diet. Quitting smoking. Using a machine to open your airway while you sleep, such as: An oral appliance. This is a mouthpiece that shifts your lower jaw forward. A CPAP device. This device blows air through a mask when you breathe out (exhale). An EPAP device. This has valves that you put in each nostril. A BIPAP device. This device blows air through a mask when you breathe in (inhale) and breathe out. Having surgery if other treatments do not work. Follow these instructions at home: Lifestyle Make changes that your doctor recommends. Eat a healthy diet. Lose weight if needed. Avoid alcohol, medicines to help you relax, and some pain medicines. Do not smoke or use any products that contain nicotine or tobacco. If you need help quitting, ask your doctor. General instructions Take over-the-counter and prescription medicines only as told by your doctor. If you were given a machine to use while you sleep, use it only as told by your doctor. If you are having surgery, make sure to tell your doctor you have sleep apnea. You may need to bring your device with you. Keep all follow-up visits. Contact a doctor if: The machine that you were given to use during sleep bothers you or does not seem to be working. You do not get better. You get worse. Get help right away if: Your chest hurts. You have  trouble breathing in enough air. You have an uncomfortable feeling in your back, arms, or stomach. You have trouble talking. One side of your body feels weak. A part of your face is hanging down. These symptoms may be an emergency. Get help right away. Call your local emergency services (911 in the [place] Do not wait  to see if the symptoms will go away. Do not drive yourself to the hospital. Summary This condition affects breathing during sleep. The most common cause is a collapsed or blocked airway. The goal of treatment is to help you breathe normally while you sleep. This information is not intended to replace advice given to you by your health care provider. Make sure you discuss any questions you have with your health care provider. Document Revised: 03/29/2021 Document Reviewed: 07/29/2020 Elsevier Patient Education  2022 [employer].

## 2021-07-07 NOTE — Progress Notes (Signed)
Kurt Underwood    [id number]    August 24, 1951  Primary Care Physician:Jones, Bernadene Bell, MD  Referring Physician: Etta Grandchild, MD [address] [place],  [place] [address]  Chief complaint:   History of obstructive sleep apnea compliant with CPAP therapy  HPI:  Sleep apnea diagnosed many years ago Uses CPAP nightly  Machine is dated  Last study was over 10 years ago Has no significant concerns with current machine  Wakes up feeling like is at a good nights rest  Occasional dryness of his mouth in the mornings  No morning headaches  Never smoker   Usually goes to bed about 11 30-12 10 to 15 minutes to fall asleep Weight has been relatively stable  Outpatient Encounter Medications as of 07/07/2021  Medication Sig   [DISCONTINUED] nystatin-triamcinolone ointment (MYCOLOG) Apply 1 application topically 2 (two) times daily. (Patient not taking: Reported on 07/07/2021)   [DISCONTINUED] tadalafil (CIALIS) 20 MG tablet Take 1 tablet (20 mg total) by mouth daily as needed for erectile dysfunction. (Patient not taking: Reported on 07/07/2021)   No facility-administered encounter medications on file as of 07/07/2021.    Allergies as of 07/07/2021   (No Known Allergies)    Past Medical History:  Diagnosis Date   Arthritis    OSA (obstructive sleep apnea)     Past Surgical History:  Procedure Laterality Date   WRIST SURGERY     Right    Family History  Problem Relation Age of Onset   Hypertension Mother    Hypertension Father    Cancer Brother    Alcohol abuse Brother    Arthritis Other    Hypertension Other    Early death Neg Hx    Heart disease Neg Hx    Hyperlipidemia Neg Hx    Kidney disease Neg Hx    Stroke Neg Hx     Social History   Socioeconomic History   Marital status: Married    Spouse name: Not on file   Number of children: Y   Years of education: Not on file   Highest education level: Not on file  Occupational History    Occupation: truck driver  Tobacco Use   Smoking status: Never   Smokeless tobacco: Never  Substance and Sexual Activity   Alcohol use: No   Drug use: No   Sexual activity: Yes  Other Topics Concern   Not on file  Social History Narrative   Not on file   Social Determinants of Health   Financial Resource Strain: Not on file  Food Insecurity: Not on file  Transportation Needs: Not on file  Physical Activity: Not on file  Stress: Not on file  Social Connections: Not on file  Intimate Partner Violence: Not on file    Review of Systems  Constitutional:  Negative for fatigue.  Respiratory:  Positive for apnea.   Psychiatric/Behavioral:  Positive for sleep disturbance.    Vitals:   07/07/21 1044  BP: 112/78  Pulse: (!) 56  Temp: 97.7 F (36.5 C)  SpO2: 99%     Physical Exam Constitutional:      Appearance: He is obese.  HENT:     Head: Normocephalic.     Nose: Nose normal.     Mouth/Throat:     Mouth: Mucous membranes are moist.  Cardiovascular:     Rate and Rhythm: Normal rate and regular rhythm.     Heart sounds: No murmur heard.  No friction rub.  Pulmonary:     Effort: No respiratory distress.     Breath sounds: No stridor. No wheezing or rhonchi.  Musculoskeletal:     Cervical back: No rigidity or tenderness.  Neurological:     Mental Status: He is alert.  Psychiatric:        Mood and Affect: Mood normal.   Results of the Epworth flowsheet 07/07/2021  Sitting and reading 0  Watching TV 0  Sitting, inactive in a public place (e.g. a theatre or a meeting) 0  As a passenger in a car for an hour without a break 0  Lying down to rest in the afternoon when circumstances permit 0  Sitting and talking to someone 0  Sitting quietly after a lunch without alcohol 1  In a car, while stopped for a few minutes in traffic 0  Total score 1    Data Reviewed: Past study reviewed  Compliance data reveals excellent compliance with CPAP with residual AHI of  0.7  Assessment:  History of obstructive sleep apnea on CPAP therapy  We will send a prescription in for a new CPAP  He is compliant with CPAP and continues to benefit from CPAP use He wakes up feeling like is at a good nights rest  Plan/Recommendations: CPAP of 15 with heated humidification  Tentative follow-up in 3 to 4 months  Encouraged to call with any significant concerns  DME referral for new CPAP   Virl Diamond MD [facility] 07/07/2021, 10:55 AM  CC: Etta Grandchild, MD

## 2021-07-11 ENCOUNTER — Telehealth: Payer: Self-pay | Admitting: Pulmonary Disease

## 2021-07-11 NOTE — Telephone Encounter (Signed)
Message sent to [employer] 

## 2021-07-12 NOTE — Telephone Encounter (Signed)
[place], [place], [place]; [place], [place],   The order is in. he should get a welcome call when his order comes up in line. His order number is [id number].   Thank you,   Luellen Pucker

## 2021-08-03 DIAGNOSIS — G4733 Obstructive sleep apnea (adult) (pediatric): Secondary | ICD-10-CM | POA: Diagnosis not present

## 2021-08-08 ENCOUNTER — Ambulatory Visit (INDEPENDENT_AMBULATORY_CARE_PROVIDER_SITE_OTHER): Payer: Medicare Other | Admitting: Internal Medicine

## 2021-08-08 ENCOUNTER — Other Ambulatory Visit: Payer: Self-pay

## 2021-08-08 ENCOUNTER — Encounter: Payer: Self-pay | Admitting: Internal Medicine

## 2021-08-08 VITALS — BP 156/96 | HR 62 | Temp 98.4°F | Resp 16 | Ht 71.0 in | Wt 218.0 lb

## 2021-08-08 DIAGNOSIS — E785 Hyperlipidemia, unspecified: Secondary | ICD-10-CM

## 2021-08-08 DIAGNOSIS — Z0000 Encounter for general adult medical examination without abnormal findings: Secondary | ICD-10-CM

## 2021-08-08 DIAGNOSIS — Z1159 Encounter for screening for other viral diseases: Secondary | ICD-10-CM

## 2021-08-08 DIAGNOSIS — N400 Enlarged prostate without lower urinary tract symptoms: Secondary | ICD-10-CM

## 2021-08-08 DIAGNOSIS — I10 Essential (primary) hypertension: Secondary | ICD-10-CM

## 2021-08-08 DIAGNOSIS — Z23 Encounter for immunization: Secondary | ICD-10-CM

## 2021-08-08 DIAGNOSIS — Z0001 Encounter for general adult medical examination with abnormal findings: Secondary | ICD-10-CM

## 2021-08-08 LAB — BASIC METABOLIC PANEL
BUN: 10 mg/dL (ref 6–23)
CO2: 30 mEq/L (ref 19–32)
Calcium: 9.4 mg/dL (ref 8.4–10.5)
Chloride: 102 mEq/L (ref 96–112)
Creatinine, Ser: 0.89 mg/dL (ref 0.40–1.50)
GFR: 86.65 mL/min (ref >60.00)
Glucose, Bld: 93 mg/dL (ref 70–99)
Potassium: 4.2 mEq/L (ref 3.5–5.1)
Sodium: 140 mEq/L (ref 135–145)

## 2021-08-08 LAB — HEPATIC FUNCTION PANEL
ALT: 28 U/L (ref 0–53)
AST: 22 U/L (ref 0–37)
Albumin: 4.4 g/dL (ref 3.5–5.2)
Alkaline Phosphatase: 86 U/L (ref 39–117)
Bilirubin, Direct: 0.1 mg/dL (ref 0.0–0.3)
Total Bilirubin: 0.7 mg/dL (ref 0.2–1.2)
Total Protein: 7.4 g/dL (ref 6.0–8.3)

## 2021-08-08 LAB — URINALYSIS, ROUTINE W REFLEX MICROSCOPIC
Bilirubin Urine: NEGATIVE
Hgb urine dipstick: NEGATIVE
Ketones, ur: NEGATIVE
Leukocytes,Ua: NEGATIVE
Nitrite: NEGATIVE
RBC / HPF: NONE SEEN (ref 0–?)
Specific Gravity, Urine: 1.015 (ref 1.000–1.030)
Total Protein, Urine: NEGATIVE
Urine Glucose: NEGATIVE
Urobilinogen, UA: 1 (ref 0.0–1.0)
pH: 7 (ref 5.0–8.0)

## 2021-08-08 LAB — LIPID PANEL
Cholesterol: 190 mg/dL (ref 0–200)
HDL: 58.3 mg/dL (ref >39.00)
LDL Cholesterol: 121 mg/dL — ABNORMAL HIGH (ref 0–99)
NonHDL: 131.65
Total CHOL/HDL Ratio: 3
Triglycerides: 54 mg/dL (ref 0.0–149.0)
VLDL: 10.8 mg/dL (ref 0.0–40.0)

## 2021-08-08 LAB — CBC WITH DIFFERENTIAL/PLATELET
Basophils Absolute: 0.1 10*3/uL (ref 0.0–0.1)
Basophils Relative: 0.9 % (ref 0.0–3.0)
Eosinophils Absolute: 0.7 10*3/uL (ref 0.0–0.7)
Eosinophils Relative: 10.7 % — ABNORMAL HIGH (ref 0.0–5.0)
HCT: 41 % (ref 39.0–52.0)
Hemoglobin: 13.6 g/dL (ref 13.0–17.0)
Lymphocytes Relative: 34.9 % (ref 12.0–46.0)
Lymphs Abs: 2.2 10*3/uL (ref 0.7–4.0)
MCHC: 33.2 g/dL (ref 30.0–36.0)
MCV: 88.7 fl (ref 78.0–100.0)
Monocytes Absolute: 0.4 10*3/uL (ref 0.1–1.0)
Monocytes Relative: 7.1 % (ref 3.0–12.0)
Neutro Abs: 2.9 10*3/uL (ref 1.4–7.7)
Neutrophils Relative %: 46.4 % (ref 43.0–77.0)
Platelets: 333 10*3/uL (ref 150.0–400.0)
RBC: 4.62 Mil/uL (ref 4.22–5.81)
RDW: 15 % (ref 11.5–15.5)
WBC: 6.2 10*3/uL (ref 4.0–10.5)

## 2021-08-08 LAB — PSA: PSA: 1.75 ng/mL (ref 0.10–4.00)

## 2021-08-08 LAB — TSH: TSH: 2.12 u[IU]/mL (ref 0.35–5.50)

## 2021-08-08 MED ORDER — ATORVASTATIN CALCIUM 20 MG PO TABS: 20.0000 mg | ORAL_TABLET | Freq: Every day | ORAL | 0 refills | Status: DC

## 2021-08-08 MED ORDER — BOOSTRIX 5-2.5-18.5 LF-MCG/0.5 IM SUSP: 0.5000 mL | Freq: Once | INTRAMUSCULAR | 0 refills | Status: AC

## 2021-08-08 MED ORDER — INDAPAMIDE 1.25 MG PO TABS: 1.2500 mg | ORAL_TABLET | Freq: Every day | ORAL | 0 refills | Status: DC

## 2021-08-08 NOTE — Progress Notes (Signed)
Subjective:  Patient ID: Kurt Underwood, male    DOB: 21-Jun-1951  Age: 70 y.o. MRN: [id number]  CC: Annual Exam, Hypertension, and Hyperlipidemia  This visit occurred during the SARS-CoV-2 public health emergency.  Safety protocols were in place, including screening questions prior to the visit, additional usage of staff PPE, and extensive cleaning of exam room while observing appropriate contact time as indicated for disinfecting solutions.    HPI Kurt Underwood presents for a CPX and to establish.  He is active and denies DOE, CP, diaphoresis, edema, or fatigue.  History Kurt Underwood has a past medical history of Arthritis and OSA (obstructive sleep apnea).   He has a past surgical history that includes Wrist surgery.   His family history includes Alcohol abuse in his brother; Arthritis in an other family member; Cancer in his brother; Hypertension in his father, mother, and another family member.He reports that he has never smoked. He has never used smokeless tobacco. He reports that he does not drink alcohol and does not use drugs.  No outpatient medications prior to visit.   No facility-administered medications prior to visit.    ROS Review of Systems  Constitutional:  Negative for diaphoresis, fatigue and unexpected weight change.  HENT: Negative.    Eyes: Negative.   Respiratory:  Positive for apnea. Negative for cough, shortness of breath and wheezing.        On CPAP  Cardiovascular:  Negative for chest pain, palpitations and leg swelling.  Gastrointestinal:  Negative for abdominal pain, blood in stool, constipation, diarrhea, nausea and vomiting.  Endocrine: Negative.   Genitourinary: Negative.  Negative for difficulty urinating, dysuria, hematuria, scrotal swelling and testicular pain.  Musculoskeletal: Negative.  Negative for arthralgias.  Skin: Negative.  Negative for color change.  Neurological: Negative.  Negative for dizziness, weakness, light-headedness and headaches.   Hematological:  Negative for adenopathy. Does not bruise/bleed easily.  Psychiatric/Behavioral: Negative.     Objective:  BP (!) 156/96 (BP Location: Left Arm, Patient Position: Sitting, Cuff Size: Large) Comment: BP (L) 156/96 (R) 152/88  Pulse 62   Temp 98.4 F (36.9 C) (Oral)   Resp 16   Ht 5\' 11"  (1.803 m)   Wt 218 lb (98.9 kg)   SpO2 98%   BMI 30.40 kg/m   Physical Exam Vitals reviewed.  HENT:     Nose: Nose normal.     Mouth/Throat:     Mouth: Mucous membranes are moist.  Eyes:     General: No scleral icterus.    Conjunctiva/sclera: Conjunctivae normal.  Cardiovascular:     Rate and Rhythm: Regular rhythm. Bradycardia present.     Heart sounds: Normal heart sounds, S1 normal and S2 normal. No murmur heard.   No friction rub. No gallop.     Comments: EKG-  Sinus bradycardia, 53 bpm Septal infarct pattern is old No LVH Early repol in V2 Flat T waves in V5V6 is new Pulmonary:     Effort: Pulmonary effort is normal.     Breath sounds: No stridor. No wheezing, rhonchi or rales.  Abdominal:     General: Abdomen is flat.     Palpations: There is no mass.     Tenderness: There is no abdominal tenderness. There is no guarding or rebound.     Hernia: No hernia is present. There is no hernia in the left inguinal area or right inguinal area.  Genitourinary:    Pubic Area: No rash.      Penis: Normal and uncircumcised. No  swelling or lesions.      Testes: Normal.     Epididymis:     Right: Normal. Not enlarged. No mass.     Left: Normal. Not enlarged. No mass.     Prostate: Enlarged. Not tender and no nodules present.     Rectum: Normal. Guaiac result negative. No mass, tenderness, anal fissure, external hemorrhoid or internal hemorrhoid. Normal anal tone.  Musculoskeletal:     Cervical back: Neck supple.     Right lower leg: No edema.     Left lower leg: No edema.  Lymphadenopathy:     Lower Body: No right inguinal adenopathy. No left inguinal adenopathy.  Skin:     General: Skin is warm and dry.     Coloration: Skin is not pale.  Neurological:     General: No focal deficit present.     Mental Status: He is alert.  Psychiatric:        Mood and Affect: Mood normal.        Behavior: Behavior normal.    Lab Results  Component Value Date   WBC 6.2 08/08/2021   HGB 13.6 08/08/2021   HCT 41.0 08/08/2021   PLT 333.0 08/08/2021   GLUCOSE 93 08/08/2021   CHOL 190 08/08/2021   TRIG 54.0 08/08/2021   HDL 58.30 08/08/2021   LDLCALC 121 (H) 08/08/2021   ALT 28 08/08/2021   AST 22 08/08/2021   NA 140 08/08/2021   K 4.2 08/08/2021   CL 102 08/08/2021   CREATININE 0.89 08/08/2021   BUN 10 08/08/2021   CO2 30 08/08/2021   TSH 2.12 08/08/2021   PSA 1.75 08/08/2021     Assessment & Plan:   Kurt Underwood was seen today for annual exam, hypertension and hyperlipidemia.  Diagnoses and all orders for this visit:  Need for prophylactic vaccination with combined diphtheria-tetanus-pertussis (DTP) vaccine -     Tdap (BOOSTRIX) 5-2.5-18.5 LF-MCG/0.5 injection; Inject 0.5 mLs into the muscle once for 1 dose.  Benign prostatic hyperplasia without lower urinary tract symptoms- His PSA is normal. -     PSA; Future -     Urinalysis, Routine w reflex microscopic; Future -     Urinalysis, Routine w reflex microscopic -     PSA  Primary hypertension- He has developed stage 2 HTN. EKG is ned for LVH. Will check labs to screen for end organ damage and secondary causes. Will start indapamide.  -     CBC with Differential/Platelet; Future -     Basic metabolic panel; Future -     Aldosterone + renin activity w/ ratio; Future -     TSH; Future -     Hepatic function panel; Future -     EKG 12-Lead -     Hepatic function panel -     TSH -     Aldosterone + renin activity w/ ratio -     Basic metabolic panel -     CBC with Differential/Platelet -     indapamide (LOZOL) 1.25 MG tablet; Take 1 tablet (1.25 mg total) by mouth daily.  Hyperlipidemia LDL goal <100-  Will start a statin for CV risk reduction. -     Lipid panel; Future -     Hepatic function panel; Future -     Hepatic function panel -     Lipid panel -     atorvastatin (LIPITOR) 20 MG tablet; Take 1 tablet (20 mg total) by mouth daily.  Encounter for general adult  medical examination with abnormal findings- Exam completed, labs reviewed, vaccines reviewed - he refused flu and pneumonia vaccines, cancer screenings are UTD.  Need for hepatitis C screening test -     Hepatitis C antibody; Future -     Hepatitis C antibody  I am having Kurt Smoker start on Boostrix, indapamide, and atorvastatin.  Meds ordered this encounter  Medications  . Tdap (BOOSTRIX) 5-2.5-18.5 LF-MCG/0.5 injection    Sig: Inject 0.5 mLs into the muscle once for 1 dose.    Dispense:  0.5 mL    Refill:  0  . indapamide (LOZOL) 1.25 MG tablet    Sig: Take 1 tablet (1.25 mg total) by mouth daily.    Dispense:  90 tablet    Refill:  0  . atorvastatin (LIPITOR) 20 MG tablet    Sig: Take 1 tablet (20 mg total) by mouth daily.    Dispense:  90 tablet    Refill:  0     Follow-up: Return in about 6 weeks (around 09/19/2021).  Sanda Linger, MD

## 2021-08-08 NOTE — Patient Instructions (Signed)

## 2021-08-14 ENCOUNTER — Encounter: Payer: Self-pay | Admitting: Internal Medicine

## 2021-08-14 LAB — HEPATITIS C ANTIBODY
Hepatitis C Ab: NONREACTIVE
SIGNAL TO CUT-OFF: 0.04 (ref <1.00)

## 2021-08-14 LAB — ALDOSTERONE + RENIN ACTIVITY W/ RATIO
ALDO / PRA Ratio: 6.9 Ratio (ref 0.9–28.9)
Aldosterone: 2 ng/dL
Renin Activity: 0.29 ng/mL/h (ref 0.25–5.82)

## 2021-09-03 DIAGNOSIS — G4733 Obstructive sleep apnea (adult) (pediatric): Secondary | ICD-10-CM | POA: Diagnosis not present

## 2021-09-13 DIAGNOSIS — H2513 Age-related nuclear cataract, bilateral: Secondary | ICD-10-CM | POA: Diagnosis not present

## 2021-09-18 ENCOUNTER — Encounter: Payer: Self-pay | Admitting: Pulmonary Disease

## 2021-09-18 ENCOUNTER — Ambulatory Visit (INDEPENDENT_AMBULATORY_CARE_PROVIDER_SITE_OTHER): Payer: Medicare Other | Admitting: Pulmonary Disease

## 2021-09-18 ENCOUNTER — Other Ambulatory Visit: Payer: Self-pay

## 2021-09-18 VITALS — BP 122/80 | HR 65 | Temp 98.2°F | Ht 71.0 in | Wt 223.0 lb

## 2021-09-18 DIAGNOSIS — Z9989 Dependence on other enabling machines and devices: Secondary | ICD-10-CM | POA: Diagnosis not present

## 2021-09-18 DIAGNOSIS — G4733 Obstructive sleep apnea (adult) (pediatric): Secondary | ICD-10-CM

## 2021-09-18 NOTE — Patient Instructions (Signed)
Continue using CPAP  We will try and get a download from your machine in about a month from now for documentation purposes  I will follow-up with you in a year  Yearly follow-ups recommended

## 2021-09-18 NOTE — Progress Notes (Signed)
Kurt Underwood    [id number]    1951-02-07  Primary Care Physician:Jones, Arvid Right, MD  Referring Physician: Janith Lima, MD [address] [place],  [place] [address]  Chief complaint:   History of obstructive sleep apnea compliant with CPAP therapy  HPI:  He just received a new machine Has been using it for about a week now  We do not have a download present  Sleep apnea diagnosed many years ago Uses CPAP nightly  Last study was over 10 years ago Has no significant concerns with current machine  Wakes up feeling like is at a good nights rest  Occasional dryness of his mouth in the mornings  No morning headaches  Never smoker   Usually goes to bed about 11 30-12 10 to 15 minutes to fall asleep Weight has been relatively stable  Outpatient Encounter Medications as of 09/18/2021  Medication Sig   atorvastatin (LIPITOR) 20 MG tablet Take 1 tablet (20 mg total) by mouth daily.   indapamide (LOZOL) 1.25 MG tablet Take 1 tablet (1.25 mg total) by mouth daily.   No facility-administered encounter medications on file as of 09/18/2021.    Allergies as of 09/18/2021   (No Known Allergies)    Past Medical History:  Diagnosis Date   Arthritis    OSA (obstructive sleep apnea)     Past Surgical History:  Procedure Laterality Date   WRIST SURGERY     Right    Family History  Problem Relation Age of Onset   Hypertension Mother    Hypertension Father    Cancer Brother    Alcohol abuse Brother    Arthritis Other    Hypertension Other    Early death Neg Hx    Heart disease Neg Hx    Hyperlipidemia Neg Hx    Kidney disease Neg Hx    Stroke Neg Hx     Social History   Socioeconomic History   Marital status: Married    Spouse name: Not on file   Number of children: Y   Years of education: Not on file   Highest education level: Not on file  Occupational History   Occupation: truck driver  Tobacco Use   Smoking status: Never   Smokeless  tobacco: Never  Substance and Sexual Activity   Alcohol use: No   Drug use: No   Sexual activity: Yes  Other Topics Concern   Not on file  Social History Narrative   Not on file   Social Determinants of Health   Financial Resource Strain: Not on file  Food Insecurity: Not on file  Transportation Needs: Not on file  Physical Activity: Not on file  Stress: Not on file  Social Connections: Not on file  Intimate Partner Violence: Not on file    Review of Systems  Constitutional:  Negative for fatigue.  Respiratory:  Positive for apnea.   Psychiatric/Behavioral:  Positive for sleep disturbance.    There were no vitals filed for this visit.    Physical Exam Constitutional:      Appearance: He is obese.  HENT:     Head: Normocephalic.     Nose: Nose normal.     Mouth/Throat:     Mouth: Mucous membranes are moist.  Cardiovascular:     Rate and Rhythm: Normal rate and regular rhythm.     Heart sounds: No murmur heard.   No friction rub.  Pulmonary:  Effort: No respiratory distress.     Breath sounds: No stridor. No wheezing or rhonchi.  Musculoskeletal:     Cervical back: No rigidity or tenderness.  Neurological:     Mental Status: He is alert.  Psychiatric:        Mood and Affect: Mood normal.   Results of the Epworth flowsheet 07/07/2021  Sitting and reading 0  Watching TV 0  Sitting, inactive in a public place (e.g. a theatre or a meeting) 0  As a passenger in a car for an hour without a break 0  Lying down to rest in the afternoon when circumstances permit 0  Sitting and talking to someone 0  Sitting quietly after a lunch without alcohol 1  In a car, while stopped for a few minutes in traffic 0  Total score 1    Data Reviewed: Past study reviewed  Last office visit compliance data reveals excellent compliance with CPAP  15 with residual AHI of 0.7  Compliance not available for today's visit-has only been using the machine for about a  week  Assessment:  History of obstructive sleep apnea on CPAP therapy  Just started on a new CPAP, CPAP of 15  He is compliant with CPAP and continues to benefit from CPAP use He wakes up feeling like he has had a good nights rest.  Plan/Recommendations: CPAP of 15 with heated humidification  Yearly follow-up  We need to download from his machine in about 4 weeks  Encouraged to call with any significant concerns   Sherrilyn Rist MD [facility] 09/18/2021, 11:25 AM  CC: Janith Lima, MD

## 2021-10-04 DIAGNOSIS — G4733 Obstructive sleep apnea (adult) (pediatric): Secondary | ICD-10-CM | POA: Diagnosis not present

## 2021-11-01 DIAGNOSIS — G4733 Obstructive sleep apnea (adult) (pediatric): Secondary | ICD-10-CM | POA: Diagnosis not present

## 2021-12-02 DIAGNOSIS — G4733 Obstructive sleep apnea (adult) (pediatric): Secondary | ICD-10-CM | POA: Diagnosis not present

## 2022-01-01 DIAGNOSIS — G4733 Obstructive sleep apnea (adult) (pediatric): Secondary | ICD-10-CM | POA: Diagnosis not present

## 2022-02-01 DIAGNOSIS — G4733 Obstructive sleep apnea (adult) (pediatric): Secondary | ICD-10-CM | POA: Diagnosis not present

## 2022-03-03 DIAGNOSIS — G4733 Obstructive sleep apnea (adult) (pediatric): Secondary | ICD-10-CM | POA: Diagnosis not present

## 2022-03-05 DIAGNOSIS — G4733 Obstructive sleep apnea (adult) (pediatric): Secondary | ICD-10-CM | POA: Diagnosis not present

## 2022-04-03 DIAGNOSIS — G4733 Obstructive sleep apnea (adult) (pediatric): Secondary | ICD-10-CM | POA: Diagnosis not present

## 2022-05-04 DIAGNOSIS — G4733 Obstructive sleep apnea (adult) (pediatric): Secondary | ICD-10-CM | POA: Diagnosis not present

## 2022-06-03 DIAGNOSIS — G4733 Obstructive sleep apnea (adult) (pediatric): Secondary | ICD-10-CM | POA: Diagnosis not present

## 2022-06-06 ENCOUNTER — Telehealth: Payer: Self-pay

## 2022-06-06 NOTE — Telephone Encounter (Signed)
LVM asking if patient is interested in scheduling AWV and CPE.

## 2022-08-09 ENCOUNTER — Encounter: Payer: Medicare Other | Admitting: Internal Medicine

## 2022-08-23 ENCOUNTER — Encounter: Payer: Self-pay | Admitting: Internal Medicine

## 2022-08-23 ENCOUNTER — Ambulatory Visit (INDEPENDENT_AMBULATORY_CARE_PROVIDER_SITE_OTHER): Payer: Medicare Other | Admitting: Internal Medicine

## 2022-08-23 VITALS — BP 152/86 | HR 64 | Temp 97.9°F | Resp 16 | Ht 71.0 in | Wt 212.0 lb

## 2022-08-23 DIAGNOSIS — N400 Enlarged prostate without lower urinary tract symptoms: Secondary | ICD-10-CM | POA: Diagnosis not present

## 2022-08-23 DIAGNOSIS — E785 Hyperlipidemia, unspecified: Secondary | ICD-10-CM

## 2022-08-23 DIAGNOSIS — Z0000 Encounter for general adult medical examination without abnormal findings: Secondary | ICD-10-CM

## 2022-08-23 DIAGNOSIS — I10 Essential (primary) hypertension: Secondary | ICD-10-CM | POA: Diagnosis not present

## 2022-08-23 LAB — URINALYSIS, ROUTINE W REFLEX MICROSCOPIC
Bilirubin Urine: NEGATIVE
Hgb urine dipstick: NEGATIVE
Ketones, ur: NEGATIVE
Leukocytes,Ua: NEGATIVE
Nitrite: NEGATIVE
Specific Gravity, Urine: 1.025 (ref 1.000–1.030)
Total Protein, Urine: NEGATIVE
Urine Glucose: NEGATIVE
Urobilinogen, UA: 0.2 (ref 0.0–1.0)
pH: 6 (ref 5.0–8.0)

## 2022-08-23 LAB — TSH: TSH: 1.83 u[IU]/mL (ref 0.35–5.50)

## 2022-08-23 LAB — BASIC METABOLIC PANEL
BUN: 15 mg/dL (ref 6–23)
CO2: 29 mEq/L (ref 19–32)
Calcium: 9.2 mg/dL (ref 8.4–10.5)
Chloride: 103 mEq/L (ref 96–112)
Creatinine, Ser: 0.85 mg/dL (ref 0.40–1.50)
GFR: 87.22 mL/min (ref >60.00)
Glucose, Bld: 86 mg/dL (ref 70–99)
Potassium: 4 mEq/L (ref 3.5–5.1)
Sodium: 141 mEq/L (ref 135–145)

## 2022-08-23 LAB — LIPID PANEL
Cholesterol: 160 mg/dL (ref 0–200)
HDL: 55.3 mg/dL (ref >39.00)
LDL Cholesterol: 96 mg/dL (ref 0–99)
NonHDL: 104.29
Total CHOL/HDL Ratio: 3
Triglycerides: 43 mg/dL (ref 0.0–149.0)
VLDL: 8.6 mg/dL (ref 0.0–40.0)

## 2022-08-23 LAB — CBC WITH DIFFERENTIAL/PLATELET
Basophils Absolute: 0.1 10*3/uL (ref 0.0–0.1)
Basophils Relative: 0.9 % (ref 0.0–3.0)
Eosinophils Absolute: 0.6 10*3/uL (ref 0.0–0.7)
Eosinophils Relative: 9.7 % — ABNORMAL HIGH (ref 0.0–5.0)
HCT: 40 % (ref 39.0–52.0)
Hemoglobin: 13.6 g/dL (ref 13.0–17.0)
Lymphocytes Relative: 39.7 % (ref 12.0–46.0)
Lymphs Abs: 2.3 10*3/uL (ref 0.7–4.0)
MCHC: 34 g/dL (ref 30.0–36.0)
MCV: 88.7 fl (ref 78.0–100.0)
Monocytes Absolute: 0.5 10*3/uL (ref 0.1–1.0)
Monocytes Relative: 9.4 % (ref 3.0–12.0)
Neutro Abs: 2.3 10*3/uL (ref 1.4–7.7)
Neutrophils Relative %: 40.3 % — ABNORMAL LOW (ref 43.0–77.0)
Platelets: 304 10*3/uL (ref 150.0–400.0)
RBC: 4.5 Mil/uL (ref 4.22–5.81)
RDW: 14.3 % (ref 11.5–15.5)
WBC: 5.7 10*3/uL (ref 4.0–10.5)

## 2022-08-23 LAB — HEPATIC FUNCTION PANEL
ALT: 20 U/L (ref 0–53)
AST: 23 U/L (ref 0–37)
Albumin: 4.3 g/dL (ref 3.5–5.2)
Alkaline Phosphatase: 64 U/L (ref 39–117)
Bilirubin, Direct: 0.1 mg/dL (ref 0.0–0.3)
Total Bilirubin: 0.4 mg/dL (ref 0.2–1.2)
Total Protein: 7.1 g/dL (ref 6.0–8.3)

## 2022-08-23 LAB — PSA: PSA: 2.23 ng/mL (ref 0.10–4.00)

## 2022-08-23 NOTE — Progress Notes (Signed)
Subjective:  Patient ID: Kurt Underwood, male    DOB: Apr 05, 1951  Age: 71 y.o. MRN: [id number]  CC: Annual Exam, Hypertension, and Hyperlipidemia   HPI Kurt Underwood presents for a CPX and f/up -   He is active and denies DOE, CP, SOB, edema, or diaphoresis.  No outpatient medications prior to visit.   No facility-administered medications prior to visit.    ROS Review of Systems  Constitutional: Negative.  Negative for diaphoresis and fatigue.  HENT: Negative.    Eyes: Negative.   Respiratory:  Negative for cough, chest tightness, shortness of breath and wheezing.   Cardiovascular:  Negative for chest pain, palpitations and leg swelling.  Gastrointestinal:  Negative for abdominal pain, diarrhea, nausea and vomiting.  Endocrine: Negative.   Genitourinary: Negative.  Negative for difficulty urinating.  Musculoskeletal: Negative.   Skin: Negative.   Allergic/Immunologic: Negative.   Neurological: Negative.   Hematological:  Negative for adenopathy. Does not bruise/bleed easily.  Psychiatric/Behavioral: Negative.      Objective:  BP (!) 152/86   Pulse 64   Temp 97.9 F (36.6 C) (Oral)   Resp 16   Ht 5\' 11"  (1.803 m)   Wt 212 lb (96.2 kg)   SpO2 98%   BMI 29.57 kg/m   BP Readings from Last 3 Encounters:  08/23/22 (!) 152/86  09/18/21 122/80  08/08/21 (!) 156/96    Wt Readings from Last 3 Encounters:  08/23/22 212 lb (96.2 kg)  09/18/21 223 lb (101.2 kg)  08/08/21 218 lb (98.9 kg)    Physical Exam Vitals reviewed.  HENT:     Mouth/Throat:     Mouth: Mucous membranes are moist.  Eyes:     General: No scleral icterus.    Conjunctiva/sclera: Conjunctivae normal.  Cardiovascular:     Rate and Rhythm: Regular rhythm. Bradycardia present.     Heart sounds: Normal heart sounds, S1 normal and S2 normal. No murmur heard.    Comments: EKG- SB, 50 bpm Septal infarct pattern is old No LVH Unchanged Pulmonary:     Effort: Pulmonary effort is normal.      Breath sounds: No stridor. No wheezing, rhonchi or rales.  Abdominal:     General: Abdomen is flat.     Palpations: There is no mass.     Tenderness: There is no abdominal tenderness. There is no guarding.     Hernia: No hernia is present.  Musculoskeletal:     Cervical back: Neck supple.     Right lower leg: No edema.     Left lower leg: No edema.  Lymphadenopathy:     Cervical: No cervical adenopathy.  Skin:    General: Skin is warm and dry.  Neurological:     General: No focal deficit present.     Mental Status: He is alert.  Psychiatric:        Mood and Affect: Mood normal.        Behavior: Behavior normal.     Lab Results  Component Value Date   WBC 5.7 08/23/2022   HGB 13.6 08/23/2022   HCT 40.0 08/23/2022   PLT 304.0 08/23/2022   GLUCOSE 86 08/23/2022   CHOL 160 08/23/2022   TRIG 43.0 08/23/2022   HDL 55.30 08/23/2022   LDLCALC 96 08/23/2022   ALT 20 08/23/2022   AST 23 08/23/2022   NA 141 08/23/2022   K 4.0 08/23/2022   CL 103 08/23/2022   CREATININE 0.85 08/23/2022   BUN 15 08/23/2022   CO2  29 08/23/2022   TSH 1.83 08/23/2022   PSA 2.23 08/23/2022    DG Lumbar Spine Complete  Result Date: 07/07/2012 *RADIOLOGY REPORT* Clinical Data: Low back pain LUMBAR SPINE - COMPLETE 4+ VIEW Comparison: None. Findings: Five views of the lumbar spine submitted.  There is mild compression deformity superior endplate of the L1 vertebral body of indeterminate age.  Clinical correlation is necessary.  Mild disc space flattening with mild anterior spurring at L1 and L2 level. Mild anterior spurring upper endplate of the L3-L4 and L5 vertebral body. IMPRESSION: Mild compression deformity superior endplate of the L1 vertebral body of indeterminate age.  Clinical correlation is necessary. Mild disc space flattening with mild anterior spurring at L1 and L2 level.  Mild anterior spurring upper endplate of the L3, L4 and L5 vertebral body. Original Report Authenticated By: Natasha Mead,  M.D.    Assessment & Plan:   Kurt Underwood was seen today for annual exam, hypertension and hyperlipidemia.  Diagnoses and all orders for this visit:  Primary hypertension- His blood pressure is not adequately well-controlled.  His EKG is negative for LVH.  Labs are negative for secondary causes or endorgan damage.  I recommended that he start taking a thiazide diuretic. -     CBC with Differential/Platelet; Future -     Hepatic function panel; Future -     TSH; Future -     Urinalysis, Routine w reflex microscopic; Future -     Basic metabolic panel; Future -     EKG 12-Lead -     Basic metabolic panel -     Urinalysis, Routine w reflex microscopic -     TSH -     Hepatic function panel -     CBC with Differential/Platelet -     indapamide (LOZOL) 1.25 MG tablet; Take 1 tablet (1.25 mg total) by mouth daily.  Hyperlipidemia LDL goal <100- His ASCVD risk score is 15%.  I recommended that he take a statin for cardiovascular risk reduction. -     Lipid panel; Future -     Hepatic function panel; Future -     TSH; Future -     TSH -     Hepatic function panel -     Lipid panel -     rosuvastatin (CRESTOR) 10 MG tablet; Take 1 tablet (10 mg total) by mouth daily.  Routine general medical examination at a health care facility- Exam completed, labs reviewed, he refused all vaccines today, cancer screenings are up-to-date, patient education was given.  Benign prostatic hyperplasia without lower urinary tract symptoms- His PSA is normal. -     Urinalysis, Routine w reflex microscopic; Future -     PSA; Future -     PSA -     Urinalysis, Routine w reflex microscopic   I am having Kurt Underwood start on rosuvastatin and indapamide.  Meds ordered this encounter  Medications   rosuvastatin (CRESTOR) 10 MG tablet    Sig: Take 1 tablet (10 mg total) by mouth daily.    Dispense:  90 tablet    Refill:  1   indapamide (LOZOL) 1.25 MG tablet    Sig: Take 1 tablet (1.25 mg total) by mouth  daily.    Dispense:  90 tablet    Refill:  1     Follow-up: Return in about 6 months (around 02/22/2023).  Sanda Linger, MD

## 2022-08-23 NOTE — Patient Instructions (Signed)
Health Maintenance, Male Adopting a healthy lifestyle and getting preventive care are important in promoting health and wellness. Ask your health care provider about: The right schedule for you to have regular tests and exams. Things you can do on your own to prevent diseases and keep yourself healthy. What should I know about diet, weight, and exercise? Eat a healthy diet  Eat a diet that includes plenty of vegetables, fruits, low-fat dairy products, and lean protein. Do not eat a lot of foods that are high in solid fats, added sugars, or sodium. Maintain a healthy weight Body mass index (BMI) is a measurement that can be used to identify possible weight problems. It estimates body fat based on height and weight. Your health care provider can help determine your BMI and help you achieve or maintain a healthy weight. Get regular exercise Get regular exercise. This is one of the most important things you can do for your health. Most adults should: Exercise for at least 150 minutes each week. The exercise should increase your heart rate and make you sweat (moderate-intensity exercise). Do strengthening exercises at least twice a week. This is in addition to the moderate-intensity exercise. Spend less time sitting. Even light physical activity can be beneficial. Watch cholesterol and blood lipids Have your blood tested for lipids and cholesterol at 71 years of age, then have this test every 5 years. You may need to have your cholesterol levels checked more often if: Your lipid or cholesterol levels are high. You are older than 71 years of age. You are at high risk for heart disease. What should I know about cancer screening? Many types of cancers can be detected early and may often be prevented. Depending on your health history and family history, you may need to have cancer screening at various ages. This may include screening for: Colorectal cancer. Prostate cancer. Skin cancer. Lung  cancer. What should I know about heart disease, diabetes, and high blood pressure? Blood pressure and heart disease High blood pressure causes heart disease and increases the risk of stroke. This is more likely to develop in people who have high blood pressure readings or are overweight. Talk with your health care provider about your target blood pressure readings. Have your blood pressure checked: Every 3-5 years if you are 18-39 years of age. Every year if you are 40 years old or older. If you are between the ages of 65 and 75 and are a current or former smoker, ask your health care provider if you should have a one-time screening for abdominal aortic aneurysm (AAA). Diabetes Have regular diabetes screenings. This checks your fasting blood sugar level. Have the screening done: Once every three years after age 45 if you are at a normal weight and have a low risk for diabetes. More often and at a younger age if you are overweight or have a high risk for diabetes. What should I know about preventing infection? Hepatitis B If you have a higher risk for hepatitis B, you should be screened for this virus. Talk with your health care provider to find out if you are at risk for hepatitis B infection. Hepatitis C Blood testing is recommended for: Everyone born from 1945 through 1965. Anyone with known risk factors for hepatitis C. Sexually transmitted infections (STIs) You should be screened each year for STIs, including gonorrhea and chlamydia, if: You are sexually active and are younger than 71 years of age. You are older than 71 years of age and your   health care provider tells you that you are at risk for this type of infection. Your sexual activity has changed since you were last screened, and you are at increased risk for chlamydia or gonorrhea. Ask your health care provider if you are at risk. Ask your health care provider about whether you are at high risk for HIV. Your health care provider  may recommend a prescription medicine to help prevent HIV infection. If you choose to take medicine to prevent HIV, you should first get tested for HIV. You should then be tested every 3 months for as long as you are taking the medicine. Follow these instructions at home: Alcohol use Do not drink alcohol if your health care provider tells you not to drink. If you drink alcohol: Limit how much you have to 0-2 drinks a day. Know how much alcohol is in your drink. In the [place] one drink equals one 12 oz bottle of beer (355 mL), one 5 oz glass of wine (148 mL), or one 1 oz glass of hard liquor (44 mL). Lifestyle Do not use any products that contain nicotine or tobacco. These products include cigarettes, chewing tobacco, and vaping devices, such as e-cigarettes. If you need help quitting, ask your health care provider. Do not use street drugs. Do not share needles. Ask your health care provider for help if you need support or information about quitting drugs. General instructions Schedule regular health, dental, and eye exams. Stay current with your vaccines. Tell your health care provider if: You often feel depressed. You have ever been abused or do not feel safe at home. Summary Adopting a healthy lifestyle and getting preventive care are important in promoting health and wellness. Follow your health care provider's instructions about healthy diet, exercising, and getting tested or screened for diseases. Follow your health care provider's instructions on monitoring your cholesterol and blood pressure. This information is not intended to replace advice given to you by your health care provider. Make sure you discuss any questions you have with your health care provider. Document Revised: 01/09/2021 Document Reviewed: 01/09/2021 Elsevier Patient Education  2023 [employer]  

## 2022-08-24 ENCOUNTER — Encounter: Payer: Self-pay | Admitting: Internal Medicine

## 2022-08-24 MED ORDER — ROSUVASTATIN CALCIUM 10 MG PO TABS: 10.0000 mg | ORAL_TABLET | Freq: Every day | ORAL | 1 refills | Status: DC

## 2022-08-24 MED ORDER — INDAPAMIDE 1.25 MG PO TABS: 1.2500 mg | ORAL_TABLET | Freq: Every day | ORAL | 1 refills | Status: DC

## 2022-11-27 ENCOUNTER — Telehealth: Payer: Self-pay

## 2022-11-27 NOTE — Telephone Encounter (Signed)
Called patient to schedule Medicare Annual Wellness Visit (AWV). Left message for patient to call back and schedule Medicare Annual Wellness Visit (AWV).  Last date of AWV: eligible 10/04/16 for AWV-I  Please schedule an appointment at any time with [facility].    Norton Blizzard, Melbeta (AAMA)  [facility] [phone]

## 2023-01-31 DIAGNOSIS — H2513 Age-related nuclear cataract, bilateral: Secondary | ICD-10-CM | POA: Diagnosis not present

## 2023-04-02 DIAGNOSIS — K648 Other hemorrhoids: Secondary | ICD-10-CM | POA: Diagnosis not present

## 2023-04-02 DIAGNOSIS — K635 Polyp of colon: Secondary | ICD-10-CM | POA: Diagnosis not present

## 2023-04-02 DIAGNOSIS — Z83719 Family history of colon polyps, unspecified: Secondary | ICD-10-CM | POA: Diagnosis not present

## 2023-04-02 DIAGNOSIS — Z1211 Encounter for screening for malignant neoplasm of colon: Secondary | ICD-10-CM | POA: Diagnosis not present

## 2023-04-02 LAB — HM COLONOSCOPY

## 2023-04-04 DIAGNOSIS — K635 Polyp of colon: Secondary | ICD-10-CM | POA: Diagnosis not present

## 2024-07-15 ENCOUNTER — Telehealth: Payer: Self-pay

## 2024-07-15 NOTE — Telephone Encounter (Signed)
 Patient is overdue for an appointment. LMOM for patient to call and schedule.

## 2024-08-05 ENCOUNTER — Ambulatory Visit: Admitting: Internal Medicine

## 2024-08-05 ENCOUNTER — Encounter: Payer: Self-pay | Admitting: Internal Medicine

## 2024-08-05 VITALS — BP 148/94 | HR 66 | Temp 98.4°F | Resp 16 | Ht 71.0 in | Wt 221.8 lb

## 2024-08-05 DIAGNOSIS — N400 Enlarged prostate without lower urinary tract symptoms: Secondary | ICD-10-CM

## 2024-08-05 DIAGNOSIS — I10 Essential (primary) hypertension: Secondary | ICD-10-CM

## 2024-08-05 DIAGNOSIS — E785 Hyperlipidemia, unspecified: Secondary | ICD-10-CM

## 2024-08-05 DIAGNOSIS — Z0001 Encounter for general adult medical examination with abnormal findings: Secondary | ICD-10-CM

## 2024-08-05 LAB — HEPATIC FUNCTION PANEL
ALT: 20 U/L (ref 0–53)
AST: 22 U/L (ref 0–37)
Albumin: 4.6 g/dL (ref 3.5–5.2)
Alkaline Phosphatase: 61 U/L (ref 39–117)
Bilirubin, Direct: 0.1 mg/dL (ref 0.0–0.3)
Total Bilirubin: 0.5 mg/dL (ref 0.2–1.2)
Total Protein: 7.4 g/dL (ref 6.0–8.3)

## 2024-08-05 LAB — URINALYSIS, ROUTINE W REFLEX MICROSCOPIC
Bilirubin Urine: NEGATIVE
Hgb urine dipstick: NEGATIVE
Ketones, ur: NEGATIVE
Leukocytes,Ua: NEGATIVE
Nitrite: NEGATIVE
RBC / HPF: NONE SEEN (ref 0–?)
Specific Gravity, Urine: 1.015 (ref 1.000–1.030)
Total Protein, Urine: NEGATIVE
Urine Glucose: NEGATIVE
Urobilinogen, UA: 0.2 (ref 0.0–1.0)
WBC, UA: NONE SEEN (ref 0–?)
pH: 7.5 (ref 5.0–8.0)

## 2024-08-05 LAB — CBC WITH DIFFERENTIAL/PLATELET
Basophils Absolute: 0.1 K/uL (ref 0.0–0.1)
Basophils Relative: 0.9 % (ref 0.0–3.0)
Eosinophils Absolute: 0.8 K/uL — ABNORMAL HIGH (ref 0.0–0.7)
Eosinophils Relative: 11.3 % — ABNORMAL HIGH (ref 0.0–5.0)
HCT: 41.8 % (ref 39.0–52.0)
Hemoglobin: 14 g/dL (ref 13.0–17.0)
Lymphocytes Relative: 32.9 % (ref 12.0–46.0)
Lymphs Abs: 2.3 K/uL (ref 0.7–4.0)
MCHC: 33.6 g/dL (ref 30.0–36.0)
MCV: 88.3 fl (ref 78.0–100.0)
Monocytes Absolute: 0.4 K/uL (ref 0.1–1.0)
Monocytes Relative: 6.3 % (ref 3.0–12.0)
Neutro Abs: 3.4 K/uL (ref 1.4–7.7)
Neutrophils Relative %: 48.6 % (ref 43.0–77.0)
Platelets: 289 K/uL (ref 150.0–400.0)
RBC: 4.74 Mil/uL (ref 4.22–5.81)
RDW: 14.7 % (ref 11.5–15.5)
WBC: 7 K/uL (ref 4.0–10.5)

## 2024-08-05 LAB — BASIC METABOLIC PANEL WITH GFR
BUN: 11 mg/dL (ref 6–23)
CO2: 28 meq/L (ref 19–32)
Calcium: 9.3 mg/dL (ref 8.4–10.5)
Chloride: 102 meq/L (ref 96–112)
Creatinine, Ser: 0.93 mg/dL (ref 0.40–1.50)
GFR: 81.3 mL/min (ref >60.00)
Glucose, Bld: 90 mg/dL (ref 70–99)
Potassium: 3.9 meq/L (ref 3.5–5.1)
Sodium: 141 meq/L (ref 135–145)

## 2024-08-05 LAB — LIPID PANEL
Cholesterol: 178 mg/dL (ref 0–200)
HDL: 49 mg/dL (ref >39.00)
LDL Cholesterol: 110 mg/dL — ABNORMAL HIGH (ref 0–99)
NonHDL: 129.47
Total CHOL/HDL Ratio: 4
Triglycerides: 95 mg/dL (ref 0.0–149.0)
VLDL: 19 mg/dL (ref 0.0–40.0)

## 2024-08-05 LAB — PSA: PSA: 3.04 ng/mL (ref 0.10–4.00)

## 2024-08-05 LAB — TSH: TSH: 2.32 u[IU]/mL (ref 0.35–5.50)

## 2024-08-05 NOTE — Progress Notes (Unsigned)
 Subjective:  Patient ID: Kurt Underwood, male    DOB: 1951-03-07  Age: 73 y.o. MRN: [id number]  CC: Hypertension, Annual Exam, and Hyperlipidemia   HPI Kurt Underwood presents for a CPX and f/up ----  Discussed the use of AI scribe software for clinical note transcription with the patient, who gave verbal consent to proceed.  History of Present Illness Kurt Underwood is a 73 year old male who presents for an annual physical exam.  He feels good and remains active, engaging in exercises such as pushups and calisthenics. He frequently walks and maintains his yard, which he enjoys and feels good doing.  No symptoms of high blood pressure, such as headaches or blurred vision. He is not currently taking any medications.  He is not willing to receive any vaccines today. He is not currently taking any medications.   Outpatient Medications Prior to Visit  Medication Sig Dispense Refill  . indapamide  (LOZOL ) 1.25 MG tablet Take 1 tablet (1.25 mg total) by mouth daily. (Patient not taking: Reported on 08/05/2024) 90 tablet 1  . rosuvastatin  (CRESTOR ) 10 MG tablet Take 1 tablet (10 mg total) by mouth daily. (Patient not taking: Reported on 08/05/2024) 90 tablet 1   No facility-administered medications prior to visit.    ROS Review of Systems  Constitutional:  Negative for appetite change, chills, diaphoresis, fatigue and fever.  HENT: Negative.    Eyes: Negative.   Respiratory: Negative.  Negative for cough, chest tightness, shortness of breath and wheezing.   Cardiovascular:  Negative for chest pain, palpitations and leg swelling.  Gastrointestinal:  Negative for abdominal pain, constipation, diarrhea, nausea and vomiting.  Genitourinary: Negative.   Musculoskeletal: Negative.  Negative for arthralgias, joint swelling and myalgias.  Skin: Negative.   Neurological:  Negative for dizziness and weakness.  Hematological:  Negative for adenopathy. Does not bruise/bleed easily.   Psychiatric/Behavioral: Negative.      Objective:  BP (!) 148/94 (BP Location: Right Arm, Patient Position: Sitting) Comment: BP (L) 144/96  Pulse 66   Temp 98.4 F (36.9 C) (Temporal)   Resp 16   Ht 5' 11 (1.803 m)   Wt 221 lb 12.8 oz (100.6 kg)   SpO2 99%   BMI 30.93 kg/m   BP Readings from Last 3 Encounters:  08/05/24 (!) 148/94  08/23/22 (!) 152/86  09/18/21 122/80    Wt Readings from Last 3 Encounters:  08/05/24 221 lb 12.8 oz (100.6 kg)  08/23/22 212 lb (96.2 kg)  09/18/21 223 lb (101.2 kg)    Physical Exam Vitals reviewed.  Constitutional:      Appearance: Normal appearance.  HENT:     Nose: Nose normal.     Mouth/Throat:     Mouth: Mucous membranes are moist.  Eyes:     General: No scleral icterus.    Conjunctiva/sclera: Conjunctivae normal.  Cardiovascular:     Rate and Rhythm: Normal rate and regular rhythm.     Heart sounds: No murmur heard.    No gallop.     Comments: EKG--- NSR, 60 bpm Septal infarct pattern is not new No LVH Unchanged  Pulmonary:     Effort: Pulmonary effort is normal.     Breath sounds: No stridor. No wheezing, rhonchi or rales.  Abdominal:     General: Abdomen is flat.     Palpations: There is no mass.     Tenderness: There is no abdominal tenderness. There is no guarding.     Hernia: No hernia is present. There  is no hernia in the left inguinal area or right inguinal area.  Genitourinary:    Pubic Area: No rash.      Penis: Normal and uncircumcised.      Testes: Normal.     Epididymis:     Right: Normal.     Left: Normal.     Prostate: Enlarged. Not tender and no nodules present.     Rectum: Normal. Guaiac result negative. No mass, tenderness, anal fissure, external hemorrhoid or internal hemorrhoid. Normal anal tone.  Musculoskeletal:        General: Normal range of motion.     Cervical back: Neck supple.     Right lower leg: No edema.     Left lower leg: No edema.  Lymphadenopathy:     Cervical: No cervical  adenopathy.     Lower Body: No right inguinal adenopathy. No left inguinal adenopathy.  Skin:    General: Skin is warm and dry.  Neurological:     General: No focal deficit present.     Mental Status: He is alert.  Psychiatric:        Mood and Affect: Mood normal.        Behavior: Behavior normal.     Lab Results  Component Value Date   WBC 7.0 08/05/2024   HGB 14.0 08/05/2024   HCT 41.8 08/05/2024   PLT 289.0 08/05/2024   GLUCOSE 90 08/05/2024   CHOL 178 08/05/2024   TRIG 95.0 08/05/2024   HDL 49.00 08/05/2024   LDLCALC 110 (H) 08/05/2024   ALT 20 08/05/2024   AST 22 08/05/2024   NA 141 08/05/2024   K 3.9 08/05/2024   CL 102 08/05/2024   CREATININE 0.93 08/05/2024   BUN 11 08/05/2024   CO2 28 08/05/2024   TSH 2.32 08/05/2024   PSA 3.04 08/05/2024    DG Lumbar Spine Complete Result Date: 07/07/2012 *RADIOLOGY REPORT* Clinical Data: Low back pain LUMBAR SPINE - COMPLETE 4+ VIEW Comparison: None. Findings: Five views of the lumbar spine submitted.  There is mild compression deformity superior endplate of the L1 vertebral body of indeterminate age.  Clinical correlation is necessary.  Mild disc space flattening with mild anterior spurring at L1 and L2 level. Mild anterior spurring upper endplate of the L3-L4 and L5 vertebral body. IMPRESSION: Mild compression deformity superior endplate of the L1 vertebral body of indeterminate age.  Clinical correlation is necessary. Mild disc space flattening with mild anterior spurring at L1 and L2 level.  Mild anterior spurring upper endplate of the L3, L4 and L5 vertebral body. Original Report Authenticated By: Anita Foster, M.D.   The 10-year ASCVD risk score (Arnett DK, et al., 2019) is: 25.9%   Values used to calculate the score:     Age: 70 years     Clincally relevant sex: Male     Is Non-Hispanic African American: Yes     Diabetic: No     Tobacco smoker: No     Systolic Blood Pressure: 148 mmHg     Is BP treated: Yes     HDL  Cholesterol: 49 mg/dL     Total Cholesterol: 178 mg/dL   Estimated Creatinine Clearance: 85.5 mL/min (by C-G formula based on SCr of 0.93 mg/dL).   Assessment & Plan:   Primary hypertension- He has not achieved his BP goal. -     EKG 12-Lead -     Basic metabolic panel with GFR; Future -     CBC with Differential/Platelet; Future -  TSH; Future -     Urinalysis, Routine w reflex microscopic; Future -     Hepatic function panel; Future -     amLODIPine  Besylate; Take 1 tablet (5 mg total) by mouth daily.  Dispense: 90 tablet; Refill: 1 -     AMB Referral [facility]  Hyperlipidemia LDL goal <100- Will restart the statin -     Lipid panel; Future -     TSH; Future -     Hepatic function panel; Future -     CT CARDIAC SCORING ([facility] LOCATIONS ONLY); Future -     Rosuvastatin  Calcium ; Take 1 tablet (10 mg total) by mouth daily.  Dispense: 90 tablet; Refill: 1 -     AMB Referral [facility] Care Management  Benign prostatic hyperplasia without lower urinary tract symptoms -     PSA; Future -     Urinalysis, Routine w reflex microscopic; Future  Encounter for general adult medical examination with abnormal findings- Exam completed, labs reviewed, vaccines reviewed (he refused them all), cancer screenings addressed, pt ed material was given.      Follow-up: Return in about 6 months (around 02/03/2025).  Debby Molt, MD

## 2024-08-05 NOTE — Patient Instructions (Signed)
 Health Maintenance, Male  Adopting a healthy lifestyle and getting preventive care are important in promoting health and wellness. Ask your health care provider about:  The right schedule for you to have regular tests and exams.  Things you can do on your own to prevent diseases and keep yourself healthy.  What should I know about diet, weight, and exercise?  Eat a healthy diet    Eat a diet that includes plenty of vegetables, fruits, low-fat dairy products, and lean protein.  Do not eat a lot of foods that are high in solid fats, added sugars, or sodium.  Maintain a healthy weight  Body mass index (BMI) is a measurement that can be used to identify possible weight problems. It estimates body fat based on height and weight. Your health care provider can help determine your BMI and help you achieve or maintain a healthy weight.  Get regular exercise  Get regular exercise. This is one of the most important things you can do for your health. Most adults should:  Exercise for at least 150 minutes each week. The exercise should increase your heart rate and make you sweat (moderate-intensity exercise).  Do strengthening exercises at least twice a week. This is in addition to the moderate-intensity exercise.  Spend less time sitting. Even light physical activity can be beneficial.  Watch cholesterol and blood lipids  Have your blood tested for lipids and cholesterol at 73 years of age, then have this test every 5 years.  You may need to have your cholesterol levels checked more often if:  Your lipid or cholesterol levels are high.  You are older than 73 years of age.  You are at high risk for heart disease.  What should I know about cancer screening?  Many types of cancers can be detected early and may often be prevented. Depending on your health history and family history, you may need to have cancer screening at various ages. This may include screening for:  Colorectal cancer.  Prostate cancer.  Skin cancer.  Lung  cancer.  What should I know about heart disease, diabetes, and high blood pressure?  Blood pressure and heart disease  High blood pressure causes heart disease and increases the risk of stroke. This is more likely to develop in people who have high blood pressure readings or are overweight.  Talk with your health care provider about your target blood pressure readings.  Have your blood pressure checked:  Every 3-5 years if you are 24-52 years of age.  Every year if you are 3 years old or older.  If you are between the ages of 60 and 72 and are a current or former smoker, ask your health care provider if you should have a one-time screening for abdominal aortic aneurysm (AAA).  Diabetes  Have regular diabetes screenings. This checks your fasting blood sugar level. Have the screening done:  Once every three years after age 66 if you are at a normal weight and have a low risk for diabetes.  More often and at a younger age if you are overweight or have a high risk for diabetes.  What should I know about preventing infection?  Hepatitis B  If you have a higher risk for hepatitis B, you should be screened for this virus. Talk with your health care provider to find out if you are at risk for hepatitis B infection.  Hepatitis C  Blood testing is recommended for:  Everyone born from [birth year] through 1965.  Anyone  with known risk factors for hepatitis C.  Sexually transmitted infections (STIs)  You should be screened each year for STIs, including gonorrhea and chlamydia, if:  You are sexually active and are younger than 73 years of age.  You are older than 73 years of age and your health care provider tells you that you are at risk for this type of infection.  Your sexual activity has changed since you were last screened, and you are at increased risk for chlamydia or gonorrhea. Ask your health care provider if you are at risk.  Ask your health care provider about whether you are at high risk for HIV. Your health care provider  may recommend a prescription medicine to help prevent HIV infection. If you choose to take medicine to prevent HIV, you should first get tested for HIV. You should then be tested every 3 months for as long as you are taking the medicine.  Follow these instructions at home:  Alcohol use  Do not drink alcohol if your health care provider tells you not to drink.  If you drink alcohol:  Limit how much you have to 0-2 drinks a day.  Know how much alcohol is in your drink. In the [place] one drink equals one 12 oz bottle of beer (355 mL), one 5 oz glass of wine (148 mL), or one 1 oz glass of hard liquor (44 mL).  Lifestyle  Do not use any products that contain nicotine or tobacco. These products include cigarettes, chewing tobacco, and vaping devices, such as e-cigarettes. If you need help quitting, ask your health care provider.  Do not use street drugs.  Do not share needles.  Ask your health care provider for help if you need support or information about quitting drugs.  General instructions  Schedule regular health, dental, and eye exams.  Stay current with your vaccines.  Tell your health care provider if:  You often feel depressed.  You have ever been abused or do not feel safe at home.  Summary  Adopting a healthy lifestyle and getting preventive care are important in promoting health and wellness.  Follow your health care provider's instructions about healthy diet, exercising, and getting tested or screened for diseases.  Follow your health care provider's instructions on monitoring your cholesterol and blood pressure.  This information is not intended to replace advice given to you by your health care provider. Make sure you discuss any questions you have with your health care provider.  Document Revised: 01/09/2021 Document Reviewed: 01/09/2021  Elsevier Patient Education  2024 [employer].

## 2024-08-06 ENCOUNTER — Ambulatory Visit: Payer: Self-pay | Admitting: Internal Medicine

## 2024-08-06 MED ORDER — ROSUVASTATIN CALCIUM 10 MG PO TABS: 10.0000 mg | ORAL_TABLET | Freq: Every day | ORAL | 1 refills | Status: AC

## 2024-08-06 MED ORDER — AMLODIPINE BESYLATE 5 MG PO TABS: 5.0000 mg | ORAL_TABLET | Freq: Every day | ORAL | 1 refills | Status: AC

## 2024-08-12 ENCOUNTER — Telehealth: Payer: Self-pay | Admitting: *Deleted

## 2024-08-12 NOTE — Progress Notes (Unsigned)
 Care Guide Pharmacy Note  08/12/2024 Name: Olive Motyka MRN: [id number] DOB: [birth year]  Referred By: Joshua Debby CROME, MD Reason for referral: Call Attempt #1 and Complex Care Management (Outreach to schedule referral with pharmacist )   Ural Acree is a 73 y.o. year old male who is a primary care patient of Bernarr, [place], MD.  Jacion Dismore was referred to the pharmacist for assistance related to: HTN and HLD  An unsuccessful telephone outreach was attempted today to contact the patient who was referred to the pharmacy team for assistance with medication management. Additional attempts will be made to contact the patient.  Thedford Franks, CMA [place]  [facility], [facility] Guide Direct Dial: [phone]  Fax: [phone] Website: [email or web address]

## 2024-08-13 ENCOUNTER — Inpatient Hospital Stay: Admission: RE | Admit: 2024-08-13 | Discharge: 2024-08-13 | Attending: Internal Medicine | Admitting: Internal Medicine

## 2024-08-13 DIAGNOSIS — E785 Hyperlipidemia, unspecified: Secondary | ICD-10-CM

## 2024-08-13 NOTE — Progress Notes (Unsigned)
 Care Guide Pharmacy Note  08/13/2024 Name: Quinten Allerton MRN: [id number] DOB: 12-13-1950  Referred By: Joshua Debby CROME, MD Reason for referral: Call Attempt #1 and Complex Care Management (Outreach to schedule referral with pharmacist )   Mando Blatz is a 73 y.o. year old male who is a primary care patient of Cari, Vandeberg, MD.  Kort Stettler was referred to the pharmacist for assistance related to: HTN and HLD  A second unsuccessful telephone outreach was attempted today to contact the patient who was referred to the pharmacy team for assistance with medication management. Additional attempts will be made to contact the patient.  Thedford Franks, CMA [place]  [facility], [facility] Guide Direct Dial: [phone]  Fax: [phone] Website: [email or web address]

## 2024-08-14 NOTE — Progress Notes (Signed)
 Care Guide Pharmacy Note  08/14/2024 Name: Kurt Underwood MRN: [id number] DOB: 11-15-1950  Referred By: Joshua Debby CROME, Kurt Underwood Reason for referral: Call Attempt #1 and Complex Care Management (Outreach to schedule referral with pharmacist )   Kurt Underwood is a 73 y.o. year old male who is a primary care patient of Kurt Underwood, [place], Kurt Underwood.  Kurt Underwood was referred to the pharmacist for assistance related to: HTN and HLD  A third unsuccessful telephone outreach was attempted today to contact the patient who was referred to the pharmacy team for assistance with medication management. The Population Health team is pleased to engage with this patient at any time in the future upon receipt of referral and should he/she be interested in assistance from the Population Health team.  Kurt Underwood, CMA [facility]  [facility], [facility] Guide Direct Dial: [phone]  Fax: [phone] Website: [email or web address]

## 2024-08-17 ENCOUNTER — Other Ambulatory Visit: Payer: Self-pay | Admitting: Internal Medicine

## 2024-08-17 DIAGNOSIS — R931 Abnormal findings on diagnostic imaging of heart and coronary circulation: Secondary | ICD-10-CM | POA: Insufficient documentation
